# Patient Record
Sex: Female | Born: 2000
Health system: Southern US, Community
[De-identification: ages and names within clinical notes are randomized; demographics above are authoritative.]

## PROBLEM LIST (undated history)

## (undated) DIAGNOSIS — L309 Dermatitis, unspecified: Secondary | ICD-10-CM

## (undated) DIAGNOSIS — J302 Other seasonal allergic rhinitis: Secondary | ICD-10-CM

## (undated) DIAGNOSIS — J45909 Unspecified asthma, uncomplicated: Secondary | ICD-10-CM

## (undated) DIAGNOSIS — J3081 Allergic rhinitis due to animal (cat) (dog) hair and dander: Secondary | ICD-10-CM

## (undated) HISTORY — DX: Unspecified asthma, uncomplicated: J45.909

## (undated) HISTORY — DX: Dermatitis, unspecified: L30.9

## (undated) HISTORY — DX: Allergic rhinitis due to animal (cat) (dog) hair and dander: J30.81

## (undated) HISTORY — PX: TONSILLECTOMY: SUR1361

---
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
## 2018-02-20 ENCOUNTER — Ambulatory Visit (INDEPENDENT_AMBULATORY_CARE_PROVIDER_SITE_OTHER): Payer: 59 | Admitting: Medical

## 2018-02-20 VITALS — BP 110/70 | HR 68 | Temp 98.2°F | Resp 16 | Ht <= 58 in | Wt 192.2 lb

## 2018-02-20 DIAGNOSIS — J301 Allergic rhinitis due to pollen: Secondary | ICD-10-CM | POA: Diagnosis not present

## 2018-02-20 DIAGNOSIS — F458 Other somatoform disorders: Secondary | ICD-10-CM

## 2018-02-20 DIAGNOSIS — J4520 Mild intermittent asthma, uncomplicated: Secondary | ICD-10-CM

## 2018-02-20 DIAGNOSIS — R0989 Other specified symptoms and signs involving the circulatory and respiratory systems: Secondary | ICD-10-CM

## 2018-02-20 DIAGNOSIS — R142 Eructation: Secondary | ICD-10-CM | POA: Diagnosis not present

## 2018-02-20 DIAGNOSIS — R198 Other specified symptoms and signs involving the digestive system and abdomen: Secondary | ICD-10-CM

## 2018-02-20 MED ORDER — MOMETASONE FURO-FORMOTEROL FUM 100-5 MCG/ACT IN AERO: 2.0000 | INHALATION_SPRAY | Freq: Two times a day (BID) | RESPIRATORY_TRACT | 5 refills | Status: DC

## 2018-02-20 MED ORDER — ALBUTEROL SULFATE HFA 108 (90 BASE) MCG/ACT IN AERS: 2.0000 | INHALATION_SPRAY | Freq: Four times a day (QID) | RESPIRATORY_TRACT | 0 refills | Status: DC | PRN

## 2018-02-20 MED ORDER — OMEPRAZOLE 40 MG PO CPDR: 40.0000 mg | DELAYED_RELEASE_CAPSULE | Freq: Every day | ORAL | 0 refills | Status: DC

## 2018-02-20 MED ORDER — MONTELUKAST SODIUM 10 MG PO TABS: 10.0000 mg | ORAL_TABLET | Freq: Every day | ORAL | 5 refills | Status: DC

## 2018-02-20 NOTE — Patient Instructions (Signed)
Begin Omeprazole acid reflux medication once daily in the morning about 45 minutes before breakfast for chest pressure, fullness and feeling of gum stuck in chest.    If not much improved within a week, then call back  Asthma: . Avoid asthma triggers such as pollen, smoke, or cold temperatures for example . You asthma is currently considered mild intermittent asthma. . In the spring and fall you can use Dulera inhaled medication 2 puffs twice daily for prevention.  This medication is a Chief of Staff" medication.  . You may use "Albuterol" rescue inhaler 1-2 puffs every 4-6 hours as needed for wheezing, shortness of breath, chest tightness or cough spells.   This is a Data processing manager" medication. . You may use "Albuterol" rescue nebulized treatment every 4-6 hours as needed for wheezing, shortness of breath, chest tightness or cough spells.   This is a Data processing manager" medication. . Begin Singulair tablet daily at bedtime to help reduce allergy symptoms which can trigger asthma symptoms.  . Use this regimen above daily during spring and fall.   Some people need to continue this regimen in the spring and fall, some people may only need to use this regimen during times of respiratory illness such as colds and flu, but some people with severe asthma need to continue this regimen year round. . Call if you have questions, or call or return if you are not improving on this regimen.  . Plan to recheck in the next 3 months for physical.   ASTHMA MEDICATIONS Two types of medications are used in asthma treatment: quick relief (or rescue) medications and controller medications. All patients with persistent asthma should have a short-acting relief medication on hand for treatment of exacerbations and a controller medication for long-term control.    Rescue medications include Albuterol, Proventil, Ventolin, ProAir, Xopenex.  Rescue medications, also called quick-relief or fast-acting medications, work immediately to relieve  asthma symptoms when they occur. They're often inhaled directly into the lungs, where they open up the airways and relieve symptoms such as wheezing, coughing, and shortness of breath, often within minutes. But as effective as they are, rescue medications don't have a long-term effect.  Controller medications include inhaled corticosteroids (ICSs), long-acting beta agonists (LABAs), leukotriene modifiers, anti-IgE medications, and combination products.  Examples include Qvar, Asmanex, Pulmicort, Advair, Symbicort, and Dulera.  Controller medications, also called preventive or maintenance medications, work over a period of time to reduce airway inflammation and help prevent asthma symptoms from occurring. They may be inhaled or swallowed as a pill or liquid.

## 2018-02-20 NOTE — Progress Notes (Signed)
Subjective:  Robyn Cook is a 17 y.o. female who presents for Chief Complaint  Patient presents with  . swollowed gum and got stuck    got stuck in chest X yesterday     She is here today for a sensation in her chest, and got stuck.  She was chewing gum yesterday and felt like a stringy piece got stuck in her throat.  She does note belching, some fullness in the chest.  She does eat acidic foods such as citrus regularly.  Denies history of swallowing issues in himself or family.  She does have asthma and needs refills on her medications.  She has allergy problems around but does get asthma flareups in spring and fall.  Her last visit here was 2015.  He denies fever, no nausea vomiting, no diarrhea, no blood in the stool.  No facial swelling tongue swelling or itchy is in the mouth.  No other aggravating or relieving factors.    No other c/o.  The following portions of the patient's history were reviewed and updated as appropriate: allergies, current medications, past family history, past medical history, past social history, past surgical history and problem list.  ROS Otherwise as in subjective above  Objective: BP 110/70   Pulse 68   Temp 98.2 F (36.8 C) (Oral)   Resp 16   Ht 4' 9.5" (1.461 m)   Wt 192 lb 3.2 oz (87.2 kg)   SpO2 98%   BMI 40.87 kg/m   General appearance: alert, no distress, well developed, well nourished HEENT: normocephalic, sclerae anicteric, conjunctiva pink and moist, TMs pearly, nares patent, no discharge or erythema, pharynx normal Oral cavity: MMM, no lesions Neck: supple, no lymphadenopathy, no thyromegaly, no masses Heart: RRR, normal S1, S2, no murmurs Lungs: CTA bilaterally, no wheezes, rhonchi, or rales Abdomen: +bs, soft, non tender, non distended, no masses, no hepatomegaly, no splenomegaly Pulses: 2+ radial pulses, 2+ pedal pulses, normal cap refill Ext: no edema   Assessment: Encounter Diagnoses  Name Primary?  . Globus sensation  Yes  . Belching   . Mild intermittent asthma without complication   . Allergic rhinitis due to pollen, unspecified seasonality      Plan: Discussed concerns, symptoms.  Doubt food item stuck in throat, but if not improving with recommendations below within 3-4 days, then call back.  Begin Omeprazole acid reflux medication once daily in the morning about 45 minutes before breakfast for chest pressure, fullness and feeling of gum stuck in chest.  I suspect globus sensation from GERD.  If not much improved within a week, then call back  Asthma: . Avoid asthma triggers such as pollen, smoke, or cold temperatures for example . You asthma is currently considered mild intermittent asthma. . In the spring and fall you can use Dulera inhaled medication 2 puffs twice daily for prevention.  This medication is a Chief of Staff" medication.  . You may use "Albuterol" rescue inhaler 1-2 puffs every 4-6 hours as needed for wheezing, shortness of breath, chest tightness or cough spells.   This is a Data processing manager" medication. . You may use "Albuterol" rescue nebulized treatment every 4-6 hours as needed for wheezing, shortness of breath, chest tightness or cough spells.   This is a Data processing manager" medication. . Begin Singulair tablet daily at bedtime to help reduce allergy symptoms which can trigger asthma symptoms.  . Use this regimen above daily during spring and fall.   Some people need to continue this regimen in the spring and fall,  some people may only need to use this regimen during times of respiratory illness such as colds and flu, but some people with severe asthma need to continue this regimen year round. . Call if you have questions, or call or return if you are not improving on this regimen.  . Plan to recheck in the next 3 months for physical.  Robyn Cook was seen today for swollowed gum and got stuck.  Diagnoses and all orders for this visit:  Globus sensation  Belching  Mild intermittent asthma without  complication  Allergic rhinitis due to pollen, unspecified seasonality  Other orders -     albuterol (PROVENTIL HFA;VENTOLIN HFA) 108 (90 Base) MCG/ACT inhaler; Inhale 2 puffs into the lungs every 6 (six) hours as needed for wheezing. -     montelukast (SINGULAIR) 10 MG tablet; Take 1 tablet (10 mg total) by mouth at bedtime. -     mometasone-formoterol (DULERA) 100-5 MCG/ACT AERO; Inhale 2 puffs into the lungs 2 (two) times daily. -     omeprazole (PRILOSEC) 40 MG capsule; Take 1 capsule (40 mg total) by mouth daily.    Follow up: 2-3 mo for physical

## 2018-02-22 ENCOUNTER — Other Ambulatory Visit: Payer: Self-pay | Admitting: Medical

## 2018-02-22 ENCOUNTER — Telehealth: Payer: Self-pay | Admitting: Medical

## 2018-02-22 NOTE — Telephone Encounter (Signed)
I received notice that insurance would not pay for [facility], but instead wants to be switched to Qvar.  This is not an apples to apples comparison medication.  Qvar is much weaker so no I do not agree to change to this.  Alternates would be Symbicort, Advair, Breo.  See if we can get 1 of these covered?

## 2018-02-28 NOTE — Telephone Encounter (Signed)
Called pharmacy and switched Elwin Sleightulera to Symbicort and went thru insurance for $65, faxed discount card

## 2018-03-01 MED ORDER — BUDESONIDE-FORMOTEROL FUMARATE 80-4.5 MCG/ACT IN AERO: 2.0000 | INHALATION_SPRAY | Freq: Two times a day (BID) | RESPIRATORY_TRACT | 0 refills | Status: DC

## 2018-03-01 NOTE — Telephone Encounter (Signed)
Called pharmacy back and after discount card went thru for $0.  Called mom and informed

## 2018-06-05 ENCOUNTER — Other Ambulatory Visit: Payer: Self-pay

## 2018-06-05 ENCOUNTER — Ambulatory Visit (HOSPITAL_COMMUNITY)
Admission: EM | Admit: 2018-06-05 | Discharge: 2018-06-05 | Disposition: A | Discharge status: Home / Self Care | Payer: 59 | Attending: Family Medicine | Admitting: Family Medicine

## 2018-06-05 ENCOUNTER — Encounter (HOSPITAL_COMMUNITY): Payer: Self-pay | Admitting: Emergency Medicine

## 2018-06-05 DIAGNOSIS — Z91048 Other nonmedicinal substance allergy status: Secondary | ICD-10-CM | POA: Diagnosis not present

## 2018-06-05 DIAGNOSIS — R0981 Nasal congestion: Secondary | ICD-10-CM

## 2018-06-05 DIAGNOSIS — M791 Myalgia, unspecified site: Secondary | ICD-10-CM

## 2018-06-05 DIAGNOSIS — J029 Acute pharyngitis, unspecified: Secondary | ICD-10-CM | POA: Diagnosis present

## 2018-06-05 DIAGNOSIS — R05 Cough: Secondary | ICD-10-CM | POA: Diagnosis not present

## 2018-06-05 DIAGNOSIS — Z79899 Other long term (current) drug therapy: Secondary | ICD-10-CM | POA: Insufficient documentation

## 2018-06-05 DIAGNOSIS — Z7951 Long term (current) use of inhaled steroids: Secondary | ICD-10-CM | POA: Insufficient documentation

## 2018-06-05 DIAGNOSIS — Z7722 Contact with and (suspected) exposure to environmental tobacco smoke (acute) (chronic): Secondary | ICD-10-CM | POA: Diagnosis not present

## 2018-06-05 LAB — POCT RAPID STREP A: STREPTOCOCCUS, GROUP A SCREEN (DIRECT): NEGATIVE

## 2018-06-05 MED ORDER — AMOXICILLIN 500 MG PO CAPS: 1000.0000 mg | ORAL_CAPSULE | Freq: Two times a day (BID) | ORAL | 0 refills | Status: DC

## 2018-06-05 NOTE — Discharge Instructions (Signed)
We will go ahead and treat you for strep throat based on your symptoms and physical exam. Antibiotics sent to pharmacy. You can do warm salt water gargles. Hot tea with honey can help Your other symptoms you can use over-the-counter medication. Follow up as needed for continued or worsening symptoms

## 2018-06-05 NOTE — ED Triage Notes (Signed)
Pt complains of nasal congestion, cough, sore throat and bilateral ear pain since Sunday.  Pt has been taking Advil for pain.

## 2018-06-05 NOTE — ED Provider Notes (Signed)
[facility]    CSN: [id number] Arrival date & time: 06/05/18  1609     History   Chief Complaint Chief Complaint  Patient presents with  . URI    HPI Robyn Cook is a 17 y.o. female.   Patient is a 17 year old female that presents with 4 days of URI symptoms.  She is positive for cough, congestion, sore throat, ear pain, body aches.  She has some tonsillar swelling with exudates. Uvula swollen.  Her symptoms have been constant and worsening.  She has Been taking Tylenol for her symptoms. Some relief with tylenol. No rashes or recent sick contacts. The sore throat has been severe. She denies any trouble swallowing or breathing.  ROS per HPI      Past Medical History:  Diagnosis Date  . Allergy to cats   . Asthma   . Eczema     Patient Active Problem List   Diagnosis Date Noted  . Allergic rhinitis 07/01/2013  . Asthma exacerbation 07/01/2013    History reviewed. No pertinent surgical history.  OB History   None      Home Medications    Prior to Admission medications   Medication Sig Start Date End Date Taking? Authorizing Provider  albuterol (PROVENTIL HFA;VENTOLIN HFA) 108 (90 Base) MCG/ACT inhaler Inhale 2 puffs into the lungs every 6 (six) hours as needed for wheezing. 02/20/18  Yes Tysinger, Kermit Balo, PA-C  Etonogestrel (NEXPLANON [place]) Inject into the skin.   Yes [provider]  montelukast (SINGULAIR) 10 MG tablet Take 1 tablet (10 mg total) by mouth at bedtime. 02/20/18  Yes Tysinger, Kermit Balo, PA-C  amoxicillin (AMOXIL) 500 MG capsule Take 2 capsules (1,000 mg total) by mouth 2 (two) times daily. 06/05/18   Dahlia Byes A, NP  budesonide-formoterol (SYMBICORT) 80-4.5 MCG/ACT inhaler Inhale 2 puffs into the lungs 2 (two) times daily. 03/01/18   Tysinger, Kermit Balo, PA-C  omeprazole (PRILOSEC) 40 MG capsule Take 1 capsule (40 mg total) by mouth daily. 02/20/18   Tysinger, Kermit Balo, PA-C    Family History No family history on  file.  Social History Social History   Tobacco Use  . Smoking status: Passive Smoke Exposure - Never Smoker  . Smokeless tobacco: Never Used  Substance Use Topics  . Alcohol use: No  . Drug use: Not on file     Allergies   Patient has no known allergies.   Review of Systems Review of Systems   Physical Exam Triage Vital Signs ED Triage Vitals  Enc Vitals Group     BP 06/05/18 1718 110/75     Pulse Rate 06/05/18 1718 99     Resp 06/05/18 1718 16     Temp 06/05/18 1718 98.7 F (37.1 C)     Temp Source 06/05/18 1718 Oral     SpO2 06/05/18 1718 99 %     Weight --      Height --      Head Circumference --      Peak Flow --      Pain Score 06/05/18 1722 8     Pain Loc --      Pain Edu? --      Excl. in GC? --    No data found.  Updated Vital Signs BP 110/75 (BP Location: Left Arm)   Pulse 99   Temp 98.7 F (37.1 C) (Oral)   Resp 16   SpO2 99%   Visual Acuity Right Eye Distance:  Left Eye Distance:   Bilateral Distance:    Right Eye Near:   Left Eye Near:    Bilateral Near:     Physical Exam  Constitutional: She is oriented to person, place, and time. She appears well-developed and well-nourished.  Very pleasant. Non toxic or ill appearing.     HENT:  Head: Normocephalic and atraumatic.  Bilateral TMs normal.  External ears normal.   posterior oropharyngeal erythema,3+ tonsillar swelling with  exudates. Uvula swelling.   tonsillar lymphadenopathy present.     Eyes: Conjunctivae are normal.  Neck: Normal range of motion.  Cardiovascular: Normal rate, regular rhythm and normal heart sounds.  Pulmonary/Chest:  Lungs clear in all fields. No dyspnea or distress. No retractions or nasal flaring.     Musculoskeletal: Normal range of motion.  Neurological: She is alert and oriented to person, place, and time.  Skin: Skin is warm and dry.  Psychiatric: She has a normal mood and affect.  Nursing note and vitals reviewed.    UC Treatments /  Results  Labs (all labs ordered are listed, but only abnormal results are displayed) Labs Reviewed  CULTURE, GROUP A STREP [facility])  POCT RAPID STREP A    EKG None  Radiology No results found.  Procedures Procedures (including critical care time)  Medications Ordered in UC Medications - No data to display  Initial Impression / Assessment and Plan / UC Course  I have reviewed the triage vital signs and the nursing notes.  Pertinent labs & imaging results that were available during my care of the patient were reviewed by me and considered in my medical decision making (see chart for details).     Strep test negative- will go ahead and treat with antibiotics based on physical exam and symptoms.  Tylenol and motrin for pain and fever. Follow up as needed for continued or worsening symptoms  Final Clinical Impressions(s) / UC Diagnoses   Final diagnoses:  Acute sore throat     Discharge Instructions     We will go ahead and treat you for strep throat based on your symptoms and physical exam. Antibiotics sent to pharmacy. You can do warm salt water gargles. Hot tea with honey can help Your other symptoms you can use over-the-counter medication. Follow up as needed for continued or worsening symptoms     ED Prescriptions    Medication Sig Dispense Auth. Provider   amoxicillin (AMOXIL) 500 MG capsule Take 2 capsules (1,000 mg total) by mouth 2 (two) times daily. 40 capsule Dahlia Byes A, NP     Controlled Substance Prescriptions [place] Controlled Substance Registry consulted? Not Applicable   Janace Aris, NP 06/06/18 [phone]

## 2018-06-08 LAB — CULTURE, GROUP A STREP (THRC)

## 2018-06-12 ENCOUNTER — Ambulatory Visit: Payer: 59 | Admitting: Medical

## 2018-06-18 ENCOUNTER — Encounter: Payer: Self-pay | Admitting: Medical

## 2018-06-18 ENCOUNTER — Ambulatory Visit (INDEPENDENT_AMBULATORY_CARE_PROVIDER_SITE_OTHER): Payer: 59 | Admitting: Medical

## 2018-06-18 VITALS — BP 112/70 | HR 108 | Temp 98.0°F | Resp 16 | Ht <= 58 in | Wt 192.8 lb

## 2018-06-18 DIAGNOSIS — J029 Acute pharyngitis, unspecified: Secondary | ICD-10-CM

## 2018-06-18 DIAGNOSIS — J039 Acute tonsillitis, unspecified: Secondary | ICD-10-CM | POA: Diagnosis not present

## 2018-06-18 LAB — POCT RAPID STREP A (OFFICE): RAPID STREP A SCREEN: NEGATIVE

## 2018-06-18 LAB — POCT MONO (EPSTEIN BARR VIRUS): Mono, POC: NEGATIVE

## 2018-06-18 MED ORDER — AZITHROMYCIN 250 MG PO TABS: ORAL_TABLET | ORAL | 0 refills | Status: DC

## 2018-06-18 MED ORDER — CEFTRIAXONE SODIUM 500 MG IJ SOLR
500.0000 mg | Freq: Once | INTRAMUSCULAR | Status: AC
  Administered 2018-06-18: 500 mg via INTRAMUSCULAR

## 2018-06-18 MED ORDER — PREDNISONE 10 MG PO TABS: ORAL_TABLET | ORAL | 0 refills | Status: DC

## 2018-06-18 NOTE — Progress Notes (Signed)
  Subjective: Robyn Cook is a 17 y.o. female who presents for evaluation of sore throat.  Has had swollen tonsils and sore throat for a few weeks now.  Was seen by urgent care 2 weeks ago, negative for strep initially but was put on amoxicillin she thinks.  Not any better at this point.  Has had body aches, chills, ear pain, horrible throat, fatigue.   No specific sick contacts.   Last intimate with boyfriend late August about a month ago (including oral sex), also share drinks with her brother at times.   Using pain killers for the throat pain.  No other aggravating or relieving factors.  No other c/o.  The following portions of the patient's history were reviewed and updated as appropriate: allergies, current medications, past medical history, past social history, past surgical history and problem list.  Past Medical History:  Diagnosis Date  . Allergy to cats   . Asthma   . Eczema     ROS as in subjective    Objective: BP 112/70   Pulse (!) 108   Temp 98 F (36.7 C) (Oral)   Resp 16   Ht 4\' 9"  (1.448 m)   Wt 192 lb 12.8 oz (87.5 kg)   SpO2 99%   BMI 41.72 kg/m   General appearance: no distress, WD/WN,somewhat ill-appearing HEENT: normocephalic, conjunctiva/corneas normal, sclerae anicteric, nares patent,mild mucoid discharge and erythema, pharynx with moderate erythema, bilat 2+ tonsils with erythema and white exudate.  Oral cavity: MMM, no lesions  Neck: supple, no lymphadenopathy, no thyromegaly Heart: RRR, normal S1, S2, no murmurs Lungs: CTA bilaterally, no wheezes, rhonchi, or rales Abdomen: +bs, soft, shotty tender anterior nodes bilat, non distended, no masses, no hepatomegaly, no splenomegaly   Laboratory Strep test done. Results:negative. Mono test done.   Results: negative    Assessment: Encounter Diagnoses  Name Primary?  . Sore throat Yes  . Tonsillitis      Plan: Gave 500mg  IM Rocephin in office, begin medications below.   Medications  prescribed today:   Advised that sore throat etiology appears to be bacterial.  Discussed symptoms, diagnosis, and possible complications including peritonsillar abscess formation.  Discussed means of prevention, precautions.  Supportive care recommended including OTC analgesics, salt water gargles, warm fluids, good hydration, and rest.  Discussed signs or symptoms that would prompt immediate evaluation.   Call or return if worse or not improving in the next 2-3 days.  Patient voiced understanding of diagnosis, recommendations, and treatment plan.  After visit summary given.   Robyn Cook was seen today for sore throat.  Diagnoses and all orders for this visit:  Sore throat -     Rapid Strep A -     Mono (Epstein Barr Virus) -     cefTRIAXone (ROCEPHIN) injection 500 mg  Tonsillitis -     cefTRIAXone (ROCEPHIN) injection 500 mg  Other orders -     azithromycin (ZITHROMAX) 250 MG tablet; 2 tablets day 1, then 1 tablet days 2-4 -     predniSONE (DELTASONE) 10 MG tablet; 6/5/4/3/2/1 taper

## 2018-11-18 ENCOUNTER — Ambulatory Visit (HOSPITAL_COMMUNITY)
Admission: EM | Admit: 2018-11-18 | Discharge: 2018-11-18 | Disposition: A | Discharge status: Home / Self Care | Payer: 59 | Attending: Family Medicine | Admitting: Family Medicine

## 2018-11-18 ENCOUNTER — Encounter (HOSPITAL_COMMUNITY): Payer: Self-pay | Admitting: Emergency Medicine

## 2018-11-18 DIAGNOSIS — J020 Streptococcal pharyngitis: Secondary | ICD-10-CM

## 2018-11-18 LAB — POCT RAPID STREP A: STREPTOCOCCUS, GROUP A SCREEN (DIRECT): POSITIVE — AB

## 2018-11-18 MED ORDER — BENZONATATE 100 MG PO CAPS: 100.0000 mg | ORAL_CAPSULE | Freq: Three times a day (TID) | ORAL | 0 refills | Status: DC

## 2018-11-18 MED ORDER — CETIRIZINE-PSEUDOEPHEDRINE ER 5-120 MG PO TB12: 1.0000 | ORAL_TABLET | Freq: Every day | ORAL | 0 refills | Status: DC

## 2018-11-18 MED ORDER — AMOXICILLIN-POT CLAVULANATE 875-125 MG PO TABS: 1.0000 | ORAL_TABLET | Freq: Two times a day (BID) | ORAL | 0 refills | Status: DC

## 2018-11-18 NOTE — Discharge Instructions (Signed)
Your rapid stress test was positive Amoxicillin daily for the next 10 days You can continue the Mucinex for other symptoms Tessalon Perls for worsening cough every 8 hours as needed Tylenol/ibuprofen for body aches and fever Follow up as needed for continued or worsening symptoms

## 2018-11-18 NOTE — ED Provider Notes (Signed)
[facility]    CSN: [id number] Arrival date & time: 11/18/18  1309     History   Chief Complaint Chief Complaint  Patient presents with  . Cough    HPI Robyn Cook is a 18 y.o. female.   Patient is a 18 year old female who presents with cough, congestion, rhinorrhea.  Her symptoms have been present and worsening over the past month.  She has now developed fever and sore throat.  She has been using Mucinex max without much relief of her symptoms.  Denies any recent sick contacts or traveling.      Past Medical History:  Diagnosis Date  . Allergy to cats   . Asthma   . Eczema     Patient Active Problem List   Diagnosis Date Noted  . Allergic rhinitis 07/01/2013  . Asthma exacerbation 07/01/2013    History reviewed. No pertinent surgical history.  OB History   No obstetric history on file.      Home Medications    Prior to Admission medications   Medication Sig Start Date End Date Taking? Authorizing Provider  albuterol (PROVENTIL HFA;VENTOLIN HFA) 108 (90 Base) MCG/ACT inhaler Inhale 2 puffs into the lungs every 6 (six) hours as needed for wheezing. 02/20/18   Tysinger, Kermit Balo, PA-C  amoxicillin-clavulanate (AUGMENTIN) 875-125 MG tablet Take 1 tablet by mouth every 12 (twelve) hours. 11/18/18   Dahlia Byes A, NP  benzonatate (TESSALON) 100 MG capsule Take 1 capsule (100 mg total) by mouth every 8 (eight) hours. 11/18/18   Dahlia Byes A, NP  budesonide-formoterol (SYMBICORT) 80-4.5 MCG/ACT inhaler Inhale 2 puffs into the lungs 2 (two) times daily. 03/01/18   Tysinger, Kermit Balo, PA-C  cetirizine-pseudoephedrine (ZYRTEC-D) 5-120 MG tablet Take 1 tablet by mouth daily. 11/18/18   Janace Aris, NP  Etonogestrel (NEXPLANON [place]) Inject into the skin.    [provider]  ibuprofen (ADVIL,MOTRIN) 200 MG tablet Take 200 mg by mouth every 6 (six) hours as needed.    [provider]  montelukast (SINGULAIR) 10 MG tablet Take 1 tablet (10 mg  total) by mouth at bedtime. 02/20/18   Tysinger, Kermit Balo, PA-C  omeprazole (PRILOSEC) 40 MG capsule Take 1 capsule (40 mg total) by mouth daily. 02/20/18   Tysinger, Kermit Balo, PA-C    Family History No family history on file.  Social History Social History   Tobacco Use  . Smoking status: Passive Smoke Exposure - Never Smoker  . Smokeless tobacco: Never Used  Substance Use Topics  . Alcohol use: No  . Drug use: Not on file     Allergies   Patient has no known allergies.   Review of Systems Review of Systems  Constitutional: Positive for activity change, chills, fatigue and fever.  HENT: Positive for congestion, postnasal drip, rhinorrhea, sinus pressure, sinus pain and sore throat.   Respiratory: Positive for cough and chest tightness.   Cardiovascular: Negative for leg swelling.  Musculoskeletal: Positive for myalgias.  Skin: Negative for color change, pallor, rash and wound.  Hematological: Positive for adenopathy.     Physical Exam Triage Vital Signs ED Triage Vitals [11/18/18 1422]  Enc Vitals Group     BP 112/69     Pulse Rate (!) 124     Resp 18     Temp (!) 100.7 F (38.2 C)     Temp src      SpO2 100 %     Weight      Height  Head Circumference      Peak Flow      Pain Score 8     Pain Loc      Pain Edu?      Excl. in GC?    No data found.  Updated Vital Signs BP 112/69   Pulse (!) 124   Temp (!) 100.7 F (38.2 C)   Resp 18   SpO2 100%   Visual Acuity Right Eye Distance:   Left Eye Distance:   Bilateral Distance:    Right Eye Near:   Left Eye Near:    Bilateral Near:     Physical Exam Constitutional:      General: She is not in acute distress.    Appearance: She is ill-appearing. She is not toxic-appearing.  HENT:     Head: Normocephalic and atraumatic.     Right Ear: Tympanic membrane and ear canal normal.     Left Ear: Tympanic membrane and ear canal normal.     Nose: Congestion and rhinorrhea present.     Mouth/Throat:      Pharynx: Posterior oropharyngeal erythema present.     Tonsils: Tonsillar exudate present. Swelling: 3+ on the right. 3+ on the left.  Eyes:     Conjunctiva/sclera: Conjunctivae normal.  Neck:     Musculoskeletal: Normal range of motion.  Cardiovascular:     Rate and Rhythm: Tachycardia present.     Heart sounds: Normal heart sounds.  Pulmonary:     Effort: Pulmonary effort is normal.     Breath sounds: Normal breath sounds.  Musculoskeletal: Normal range of motion.  Lymphadenopathy:     Cervical: Cervical adenopathy present.  Skin:    General: Skin is warm and dry.  Neurological:     Mental Status: She is alert.  Psychiatric:        Mood and Affect: Mood normal.      UC Treatments / Results  Labs (all labs ordered are listed, but only abnormal results are displayed) Labs Reviewed  POCT RAPID STREP A - Abnormal; Notable for the following components:      Result Value   Streptococcus, Group A Screen (Direct) POSITIVE (*)    All other components within normal limits    EKG None  Radiology No results found.  Procedures Procedures (including critical care time)  Medications Ordered in UC Medications - No data to display  Initial Impression / Assessment and Plan / UC Course  I have reviewed the triage vital signs and the nursing notes.  Pertinent labs & imaging results that were available during my care of the patient were reviewed by me and considered in my medical decision making (see chart for details).     Patient's rapid strep test was positive We will go ahead and treat for strep throat with amoxicillin daily for the next 10 days This will also treat any lower respiratory tract infection or sinus infection that she may have. Tylenol/ibuprofen for body aches and fever Mucinex as needed for cough, congestion Tessalon Perles for worsening cough Follow up as needed for continued or worsening symptoms  Final Clinical Impressions(s) / UC Diagnoses   Final  diagnoses:  Strep pharyngitis     Discharge Instructions     Your rapid stress test was positive Amoxicillin daily for the next 10 days You can continue the Mucinex for other symptoms Tessalon Perls for worsening cough every 8 hours as needed Tylenol/ibuprofen for body aches and fever Follow up as needed for continued or worsening  symptoms     ED Prescriptions    Medication Sig Dispense Auth. Provider   amoxicillin-clavulanate (AUGMENTIN) 875-125 MG tablet Take 1 tablet by mouth every 12 (twelve) hours. 14 tablet Monroe Toure A, NP   benzonatate (TESSALON) 100 MG capsule Take 1 capsule (100 mg total) by mouth every 8 (eight) hours. 21 capsule Zeven Kocak A, NP   cetirizine-pseudoephedrine (ZYRTEC-D) 5-120 MG tablet Take 1 tablet by mouth daily. 30 tablet Dahlia Byes A, NP     Controlled Substance Prescriptions [place] Controlled Substance Registry consulted? Not Applicable   Janace Aris, NP 11/18/18 1502

## 2018-11-18 NOTE — ED Triage Notes (Signed)
Pt c/o cough x 1 month

## 2019-03-06 ENCOUNTER — Encounter: Payer: 59 | Admitting: Medical

## 2019-03-06 ENCOUNTER — Other Ambulatory Visit: Payer: Self-pay

## 2019-03-06 NOTE — Progress Notes (Signed)
error 

## 2019-04-15 ENCOUNTER — Ambulatory Visit (HOSPITAL_COMMUNITY)
Admission: EM | Admit: 2019-04-15 | Discharge: 2019-04-15 | Disposition: A | Discharge status: Home / Self Care | Payer: No Typology Code available for payment source | Attending: Family Medicine | Admitting: Family Medicine

## 2019-04-15 ENCOUNTER — Encounter (HOSPITAL_COMMUNITY): Payer: Self-pay

## 2019-04-15 ENCOUNTER — Other Ambulatory Visit: Payer: Self-pay

## 2019-04-15 DIAGNOSIS — Z20828 Contact with and (suspected) exposure to other viral communicable diseases: Secondary | ICD-10-CM

## 2019-04-15 DIAGNOSIS — Z20822 Contact with and (suspected) exposure to covid-19: Secondary | ICD-10-CM

## 2019-04-15 NOTE — ED Triage Notes (Signed)
Patient presents to Urgent Care with complaints of needing a COVID test since exposure at work. Patient reports she has no symptoms.

## 2019-04-15 NOTE — ED Provider Notes (Signed)
[place]    CSN: [id number] Arrival date & time: 04/15/19  0807      History   Chief Complaint Chief Complaint  Patient presents with  . COVID Testing    HPI Robyn Cook is a 18 y.o. female.   HPI  Patient is here requesting coronavirus testing.  There is been coronavirus reported in a couple of the building where she works.  She feels she is at increased risk because of asthma.  She has not been directly exposed to anyone with coronavirus that she knows of.  She is having no symptoms.  No increased shortness of breath, cough, fever chills, body aches.  Past Medical History:  Diagnosis Date  . Allergy to cats   . Asthma   . Eczema     Patient Active Problem List   Diagnosis Date Noted  . Allergic rhinitis 07/01/2013  . Asthma exacerbation 07/01/2013    History reviewed. No pertinent surgical history.  OB History   No obstetric history on file.      Home Medications    Prior to Admission medications   Medication Sig Start Date End Date Taking? Authorizing Provider  albuterol (PROVENTIL HFA;VENTOLIN HFA) 108 (90 Base) MCG/ACT inhaler Inhale 2 puffs into the lungs every 6 (six) hours as needed for wheezing. 02/20/18   Tysinger, Camelia Eng, PA-C  budesonide-formoterol (SYMBICORT) 80-4.5 MCG/ACT inhaler Inhale 2 puffs into the lungs 2 (two) times daily. 03/01/18   Tysinger, Camelia Eng, PA-C  Etonogestrel (NEXPLANON [place]) Inject into the skin.    [provider]  ibuprofen (ADVIL,MOTRIN) 200 MG tablet Take 200 mg by mouth every 6 (six) hours as needed.    [provider]  montelukast (SINGULAIR) 10 MG tablet Take 1 tablet (10 mg total) by mouth at bedtime. 02/20/18   Tysinger, Camelia Eng, PA-C  omeprazole (PRILOSEC) 40 MG capsule Take 1 capsule (40 mg total) by mouth daily. 02/20/18 04/15/19  Tysinger, Camelia Eng, PA-C    Family History Family History  Problem Relation Age of Onset  . Asthma Mother   . Hypertension Father     Social History  Social History   Tobacco Use  . Smoking status: Passive Smoke Exposure - Never Smoker  . Smokeless tobacco: Never Used  Substance Use Topics  . Alcohol use: No  . Drug use: Not on file     Allergies   Patient has no known allergies.   Review of Systems Review of Systems  Constitutional: Negative for chills and fever.  HENT: Negative for ear pain and sore throat.   Eyes: Negative for pain and visual disturbance.  Respiratory: Negative for cough and shortness of breath.   Cardiovascular: Negative for chest pain and palpitations.  Gastrointestinal: Negative for abdominal pain and vomiting.  Genitourinary: Negative for dysuria and hematuria.  Musculoskeletal: Negative for arthralgias and back pain.  Skin: Negative for color change and rash.  Neurological: Negative for seizures and syncope.  All other systems reviewed and are negative.    Physical Exam Triage Vital Signs ED Triage Vitals  Enc Vitals Group     BP 04/15/19 0841 127/75     Pulse Rate 04/15/19 0841 80     Resp 04/15/19 0841 16     Temp 04/15/19 0841 97.9 F (36.6 C)     Temp Source 04/15/19 0841 Temporal     SpO2 04/15/19 0841 100 %     Weight --      Height --  Head Circumference --      Peak Flow --      Pain Score 04/15/19 0845 0     Pain Loc --      Pain Edu? --      Excl. in [place]? --    No data found.  Updated Vital Signs BP 127/75 (BP Location: Left Arm)   Pulse 80   Temp 97.9 F (36.6 C) (Temporal)   Resp 16   SpO2 100%   Visual Acuity Right Eye Distance:   Left Eye Distance:   Bilateral Distance:    Right Eye Near:   Left Eye Near:    Bilateral Near:     Physical Exam Constitutional:      General: She is not in acute distress.    Appearance: She is well-developed.  HENT:     Head: Normocephalic and atraumatic.  Eyes:     Conjunctiva/sclera: Conjunctivae normal.     Pupils: Pupils are equal, round, and reactive to light.  Neck:     Musculoskeletal: Normal range of  motion.  Cardiovascular:     Rate and Rhythm: Normal rate and regular rhythm.     Heart sounds: Normal heart sounds.  Pulmonary:     Effort: Pulmonary effort is normal. No respiratory distress.     Breath sounds: Normal breath sounds.  Abdominal:     General: There is no distension.     Palpations: Abdomen is soft.  Musculoskeletal: Normal range of motion.  Skin:    General: Skin is warm and dry.  Neurological:     Mental Status: She is alert.  Psychiatric:        Behavior: Behavior normal.      UC Treatments / Results  Labs (all labs ordered are listed, but only abnormal results are displayed) Labs Reviewed  NOVEL CORONAVIRUS, NAA (HOSPITAL ORDER, SEND-OUT TO REF LAB)    EKG   Radiology No results found.  Procedures Procedures (including critical care time)  Medications Ordered in UC Medications - No data to display  Initial Impression / Assessment and Plan / UC Course  I have reviewed the triage vital signs and the nursing notes.  Pertinent labs & imaging results that were available during my care of the patient were reviewed by me and considered in my medical decision making (see chart for details).    We discussed coronavirus.  Contagiousness.  Exposure.  Patient thought she could come in for coronavirus test and then go back to work today.  I explained to her that if she has another concern to warrant Cross virus testing, she must stay home until her test result is confirmed negative. Final Clinical Impressions(s) / UC Diagnoses   Final diagnoses:  Exposure to Covid-19 Virus     Discharge Instructions     You may not return to work until the covid test is confirmed negative    ED Prescriptions    None     Controlled Substance Prescriptions [place] Controlled Substance Registry consulted? Not Applicable   Eustace MooreNelson, Ouita Nish Sue, MD 04/15/19 [phone]

## 2019-04-15 NOTE — Discharge Instructions (Signed)
You may not return to work until the covid test is confirmed negative

## 2019-04-17 LAB — NOVEL CORONAVIRUS, NAA (HOSP ORDER, SEND-OUT TO REF LAB; TAT 18-24 HRS): SARS-CoV-2, NAA: NOT DETECTED

## 2019-08-22 ENCOUNTER — Encounter (HOSPITAL_COMMUNITY): Payer: Self-pay | Admitting: Emergency Medicine

## 2019-08-22 ENCOUNTER — Emergency Department (HOSPITAL_COMMUNITY)
Admission: EM | Admit: 2019-08-22 | Discharge: 2019-08-22 | Disposition: A | Discharge status: Home / Self Care | Payer: No Typology Code available for payment source | Attending: Emergency Medicine | Admitting: Emergency Medicine

## 2019-08-22 ENCOUNTER — Other Ambulatory Visit: Payer: Self-pay

## 2019-08-22 DIAGNOSIS — J029 Acute pharyngitis, unspecified: Secondary | ICD-10-CM | POA: Insufficient documentation

## 2019-08-22 DIAGNOSIS — Z79899 Other long term (current) drug therapy: Secondary | ICD-10-CM | POA: Diagnosis not present

## 2019-08-22 DIAGNOSIS — Z7722 Contact with and (suspected) exposure to environmental tobacco smoke (acute) (chronic): Secondary | ICD-10-CM | POA: Insufficient documentation

## 2019-08-22 LAB — GROUP A STREP BY PCR: Group A Strep by PCR: NOT DETECTED

## 2019-08-22 MED ORDER — DEXAMETHASONE 6 MG PO TABS
10.0000 mg | ORAL_TABLET | Freq: Once | ORAL | Status: AC
  Administered 2019-08-22: 10 mg via ORAL
  Filled 2019-08-22: qty 1

## 2019-08-22 NOTE — ED Triage Notes (Signed)
Patient reports sore throat with swelling onset this week , no cough , denies fever or chills . Airway intact / no oral swelling , respirations unlabored .

## 2019-08-22 NOTE — Discharge Instructions (Addendum)
Please follow up with your ENT doctor for recheck of sore throat. Return to the emergency department with any worsening symptoms or new concern.

## 2019-08-22 NOTE — ED Provider Notes (Signed)
[facility] Provider Note   CSN: [id number] Arrival date & time: 08/22/19  0112     History   Chief Complaint Chief Complaint  Patient presents with  . Sore Throat    HPI Robyn Cook is a 18 y.o. female.     Patient to ED with pain from tonsil stones and swelling. She has a history of tonsillar stones and has been able to remove a few on her own this week. She now feels like there is more swelling so presented for evaluation. No fever, congestion, cough, body aches. She is followed by ENT and reports multiple peritonsillar abscesses in the past as well and discussion with ENT about removing her tonsils.   The history is provided by the patient. No language interpreter was used.  Sore Throat Pertinent negatives include no headaches and no shortness of breath.    Past Medical History:  Diagnosis Date  . Allergy to cats   . Asthma   . Eczema     Patient Active Problem List   Diagnosis Date Noted  . Allergic rhinitis 07/01/2013  . Asthma exacerbation 07/01/2013    History reviewed. No pertinent surgical history.   OB History   No obstetric history on file.      Home Medications    Prior to Admission medications   Medication Sig Start Date End Date Taking? Authorizing Provider  albuterol (PROVENTIL HFA;VENTOLIN HFA) 108 (90 Base) MCG/ACT inhaler Inhale 2 puffs into the lungs every 6 (six) hours as needed for wheezing. 02/20/18   Tysinger, Camelia Eng, PA-C  budesonide-formoterol (SYMBICORT) 80-4.5 MCG/ACT inhaler Inhale 2 puffs into the lungs 2 (two) times daily. 03/01/18   Tysinger, Camelia Eng, PA-C  Etonogestrel (NEXPLANON [place]) Inject into the skin.    [provider]  ibuprofen (ADVIL,MOTRIN) 200 MG tablet Take 200 mg by mouth every 6 (six) hours as needed.    [provider]  montelukast (SINGULAIR) 10 MG tablet Take 1 tablet (10 mg total) by mouth at bedtime. 02/20/18   Tysinger, Camelia Eng, PA-C  omeprazole  (PRILOSEC) 40 MG capsule Take 1 capsule (40 mg total) by mouth daily. 02/20/18 04/15/19  Tysinger, Camelia Eng, PA-C    Family History Family History  Problem Relation Age of Onset  . Asthma Mother   . Hypertension Father     Social History Social History   Tobacco Use  . Smoking status: Passive Smoke Exposure - Never Smoker  . Smokeless tobacco: Never Used  Substance Use Topics  . Alcohol use: No  . Drug use: Never     Allergies   Patient has no known allergies.   Review of Systems Review of Systems  Constitutional: Negative for chills and fever.  HENT: Positive for sore throat. Negative for congestion, ear pain, rhinorrhea and trouble swallowing.   Respiratory: Negative for cough and shortness of breath.   Gastrointestinal: Negative for nausea.  Musculoskeletal: Negative for myalgias.  Neurological: Negative for headaches.     Physical Exam Updated Vital Signs BP 117/79 (BP Location: Right Arm)   Pulse 98   Temp 99.1 F (37.3 C) (Oral)   Resp 20   Ht 4\' 10"  (1.473 m)   Wt 84.8 kg   SpO2 99%   BMI 39.08 kg/m   Physical Exam Constitutional:      Appearance: She is well-developed.  HENT:     Nose: No rhinorrhea.     Mouth/Throat:     Mouth: Mucous membranes are moist.  Comments: Large tonsils that do not appear edematous. No tonsillar stones visualized. No erythema. No peritonsillar abscess visualized. Uvula midline.  Neck:     Musculoskeletal: Normal range of motion.  Pulmonary:     Effort: Pulmonary effort is normal.  Lymphadenopathy:     Cervical: No cervical adenopathy.  Skin:    General: Skin is warm and dry.  Neurological:     Mental Status: She is alert and oriented to person, place, and time.      ED Treatments / Results  Labs (all labs ordered are listed, but only abnormal results are displayed) Labs Reviewed  GROUP A STREP BY PCR    EKG None  Radiology No results found.  Procedures Procedures (including critical care time)   Medications Ordered in ED Medications - No data to display   Initial Impression / Assessment and Plan / ED Course  I have reviewed the triage vital signs and the nursing notes.  Pertinent labs & imaging results that were available during my care of the patient were reviewed by me and considered in my medical decision making (see chart for details).        Patient to ED with sore throat developing over the last week, presence of recurrent tonsillar stones and now swelling. History of PT abscess. No fever.   Strep is negative. No evidence concerning for abscess formation. Will provide single dose decadron for swelling. Encouraged to see her ENT for recheck in the coming week.   Final Clinical Impressions(s) / ED Diagnoses   Final diagnoses:  None   1. Pharyngitis   ED Discharge Orders    None       Elpidio Anis, PA-C 08/22/19 0400    Wilkie Aye Mayer Masker, MD 08/22/19 [phone]

## 2019-09-03 ENCOUNTER — Ambulatory Visit (HOSPITAL_COMMUNITY)
Admission: EM | Admit: 2019-09-03 | Discharge: 2019-09-03 | Disposition: A | Discharge status: Home / Self Care | Payer: No Typology Code available for payment source | Attending: Emergency Medicine | Admitting: Emergency Medicine

## 2019-09-03 ENCOUNTER — Other Ambulatory Visit: Payer: Self-pay

## 2019-09-03 ENCOUNTER — Encounter (HOSPITAL_COMMUNITY): Payer: Self-pay | Admitting: Emergency Medicine

## 2019-09-03 DIAGNOSIS — Z20828 Contact with and (suspected) exposure to other viral communicable diseases: Secondary | ICD-10-CM | POA: Diagnosis present

## 2019-09-03 DIAGNOSIS — Z0489 Encounter for examination and observation for other specified reasons: Secondary | ICD-10-CM | POA: Diagnosis not present

## 2019-09-03 DIAGNOSIS — Z20822 Contact with and (suspected) exposure to covid-19: Secondary | ICD-10-CM

## 2019-09-03 NOTE — Discharge Instructions (Addendum)
Your COVID test result  will be available in 2 -7 days You should remain isolated in your home for 10 days from symptom onset  Get plenty of rest and push fluids Use medications daily for symptom relief Use OTC medications like ibuprofen or tylenol as needed fever or pain Call or go to the ED if you have any new or worsening symptoms such as fever, worsening cough, shortness of breath, chest tightness, chest pain, turning blue, changes in mental status, etc..Marland Kitchen

## 2019-09-03 NOTE — ED Provider Notes (Signed)
[facility]    CSN: [id number] Arrival date & time: 09/03/19  1750      History   Chief Complaint Chief Complaint  Patient presents with  . URI    covid test    HPI Robyn Cook is a 18 y.o. female.   Robyn Cook is old female presented to the urgent care with complaint that she wants to be tested for COVID-19.  She is having tonsillectomy done on 12/16 and she needs to have two negative Covid test.  Denies sick exposure to flu or strep.  Denies recent travel.  Denies aggravating or alleviating symptoms.  Denies previous COVID infection.   Denies fever, chills, fatigue, nasal congestion, rhinorrhea, sore throat, SOB, wheezing, chest pain, nausea, vomiting, changes in bowel or bladder habits. COVID testing ordered.  It will take between 2-7 days for test results.  Someone will contact you regarding abnormal results.       The history is provided by the patient. No language interpreter was used.    Past Medical History:  Diagnosis Date  . Allergy to cats   . Asthma   . Eczema     Patient Active Problem List   Diagnosis Date Noted  . Allergic rhinitis 07/01/2013  . Asthma exacerbation 07/01/2013    History reviewed. No pertinent surgical history.  OB History   No obstetric history on file.      Home Medications    Prior to Admission medications   Medication Sig Start Date End Date Taking? Authorizing Provider  albuterol (PROVENTIL HFA;VENTOLIN HFA) 108 (90 Base) MCG/ACT inhaler Inhale 2 puffs into the lungs every 6 (six) hours as needed for wheezing. 02/20/18   Tysinger, Kermit Balo, PA-C  budesonide-formoterol (SYMBICORT) 80-4.5 MCG/ACT inhaler Inhale 2 puffs into the lungs 2 (two) times daily. 03/01/18   Tysinger, Kermit Balo, PA-C  Etonogestrel (NEXPLANON [place]) Inject into the skin.    [provider]  ibuprofen (ADVIL,MOTRIN) 200 MG tablet Take 800 mg by mouth every 6 (six) hours as needed for headache or mild pain.     [provider]  montelukast (SINGULAIR) 10 MG tablet Take 1 tablet (10 mg total) by mouth at bedtime. 02/20/18   Tysinger, Kermit Balo, PA-C  omeprazole (PRILOSEC) 40 MG capsule Take 1 capsule (40 mg total) by mouth daily. 02/20/18 04/15/19  Tysinger, Kermit Balo, PA-C    Family History Family History  Problem Relation Age of Onset  . Asthma Mother   . Hypertension Father     Social History Social History   Tobacco Use  . Smoking status: Passive Smoke Exposure - Never Smoker  . Smokeless tobacco: Never Used  Substance Use Topics  . Alcohol use: No  . Drug use: Never     Allergies   Latex   Review of Systems Review of Systems  Constitutional: Negative.   HENT: Negative.   Respiratory: Negative.   Cardiovascular: Negative.   Gastrointestinal: Negative.   Neurological: Negative.   ROS: All other are negatives   Physical Exam Triage Vital Signs ED Triage Vitals  Enc Vitals Group     BP      Pulse      Resp      Temp      Temp src      SpO2      Weight      Height      Head Circumference      Peak Flow      Pain  Score      Pain Loc      Pain Edu?      Excl. in [place]?    No data found.  Updated Vital Signs BP 122/77 (BP Location: Left Arm)   Pulse 71   Temp 99.8 F (37.7 C) (Oral)   Resp 14   LMP 09/03/2019   SpO2 100%   Visual Acuity Right Eye Distance:   Left Eye Distance:   Bilateral Distance:    Right Eye Near:   Left Eye Near:    Bilateral Near:     Physical Exam Vitals signs and nursing note reviewed.  Constitutional:      General: She is not in acute distress.    Appearance: Normal appearance. She is normal weight. She is not ill-appearing or toxic-appearing.  HENT:     Head: Normocephalic.     Right Ear: Tympanic membrane, ear canal and external ear normal. There is no impacted cerumen.     Left Ear: Tympanic membrane and external ear normal. There is no impacted cerumen.     Nose: Nose normal. No congestion.     Mouth/Throat:     Mouth:  Mucous membranes are moist.     Pharynx: Oropharynx is clear. No oropharyngeal exudate.  Cardiovascular:     Rate and Rhythm: Normal rate and regular rhythm.     Pulses: Normal pulses.     Heart sounds: Normal heart sounds. No murmur.  Pulmonary:     Effort: Pulmonary effort is normal. No respiratory distress.     Breath sounds: Normal breath sounds. No wheezing.  Chest:     Chest wall: No tenderness.  Neurological:     Mental Status: She is oriented to person, place, and time.      UC Treatments / Results  Labs (all labs ordered are listed, but only abnormal results are displayed) Labs Reviewed  NOVEL CORONAVIRUS, NAA (HOSP ORDER, SEND-OUT TO REF LAB; TAT 18-24 HRS)    EKG   Radiology No results found.  Procedures Procedures (including critical care time)  Medications Ordered in UC Medications - No data to display  Initial Impression / Assessment and Plan / UC Course  I have reviewed the triage vital signs and the nursing notes.  Pertinent labs & imaging results that were available during my care of the patient were reviewed by me and considered in my medical decision making (see chart for details).     Patient stable for discharge.  Benign physical exam.  COVID-19 test  was completed.  Will call patient if result is abnormal. Final Clinical Impressions(s) / UC Diagnoses   Final diagnoses:  Suspected COVID-19 virus infection     Discharge Instructions      Your COVID test result  will be available in 2 -7 days You should remain isolated in your home for 10 days from symptom onset  Get plenty of rest and push fluids Use medications daily for symptom relief Use OTC medications like ibuprofen or tylenol as needed fever or pain Call or go to the ED if you have any new or worsening symptoms such as fever, worsening cough, shortness of breath, chest tightness, chest pain, turning blue, changes in mental status, etc...    ED Prescriptions    None     PDMP  not reviewed this encounter.   Emerson Monte, FNP 09/03/19 1918

## 2019-09-03 NOTE — ED Triage Notes (Signed)
Pt here to get covid testing... reports co-worker tested positive  Pt is asymptomatic...  A&O x4... no acute distress... ambulatory

## 2019-09-05 LAB — NOVEL CORONAVIRUS, NAA (HOSP ORDER, SEND-OUT TO REF LAB; TAT 18-24 HRS): SARS-CoV-2, NAA: NOT DETECTED

## 2019-09-09 ENCOUNTER — Telehealth: Payer: Self-pay | Admitting: Medical

## 2019-09-09 MED ORDER — MONTELUKAST SODIUM 10 MG PO TABS: 10.0000 mg | ORAL_TABLET | Freq: Every day | ORAL | 3 refills | Status: DC

## 2019-09-09 MED ORDER — BUDESONIDE-FORMOTEROL FUMARATE 80-4.5 MCG/ACT IN AERO: 2.0000 | INHALATION_SPRAY | Freq: Two times a day (BID) | RESPIRATORY_TRACT | 3 refills | Status: DC

## 2019-09-09 MED ORDER — ALBUTEROL SULFATE HFA 108 (90 BASE) MCG/ACT IN AERS: 2.0000 | INHALATION_SPRAY | Freq: Four times a day (QID) | RESPIRATORY_TRACT | 0 refills | Status: DC | PRN

## 2019-09-09 NOTE — Telephone Encounter (Signed)
Pt called and stated that she is having surgery tomorrow and needs a refill on her Albuterol inhaler and her Symbicort inhaler and her Singulair sent to the [facility] on [address]

## 2019-09-17 ENCOUNTER — Telehealth: Payer: Self-pay

## 2019-09-17 ENCOUNTER — Ambulatory Visit: Payer: Self-pay

## 2019-09-17 ENCOUNTER — Other Ambulatory Visit: Payer: Self-pay | Admitting: Medical

## 2019-09-17 MED ORDER — POLYETHYLENE GLYCOL 3350 17 GM/SCOOP PO POWD: ORAL | 0 refills | Status: DC

## 2019-09-17 NOTE — Telephone Encounter (Signed)
Pt. Called stating she recently had surgery on her tonsils and was prescribed Oxycodone, pt. Is now constipated has not pooped in about 1 week, she tried taking magnesium citrate but couldn't drink it because it burned her throat so bad from where she had surgery. Is there anything else she can try to have a bowel movement, pt. Has stomach cramps and it is starting to hurt when she is walking, please advise.

## 2019-09-17 NOTE — Telephone Encounter (Signed)
I called and spoke to patient.  She took oxycodone post surgery, status post tonsillectomy has been backed up with constipation since.  Has not had a bowel movement in 1 week.  She usually has a daily bowel movement prior to this week.  She is drinking plenty of water.  I advised over-the-counter enema as a first step, explained this.  As a second measure I advised MiraLAX 1 capful per hour until she has a bowel movement sometime this evening  Advise she call tomorrow if no bowel movement  If much worse pain or severe pain of the belly and go to emergency department

## 2019-09-17 NOTE — Telephone Encounter (Signed)
Pt. Reports she had her tonsils out 1 week ago. Has not had a bowel movement since then. Has taken stool softener and MOM. States she is going to try Mad citrate. Instructed if that did not work, to call her PCP. Verbalizes understanding.  Reason for Disposition . Treating constipation with Over-The-Counter (OTC) medicines, questions about  Answer Assessment - Initial Assessment Questions 1. STOOL PATTERN OR FREQUENCY: "How often do you pass bowel movements (BMs)?"  (Normal range: tid to q 3 days)  "When was the last BM passed?"       Goes daily 2. STRAINING: "Do you have to strain to have a BM?"      No 3. RECTAL PAIN: "Does your rectum hurt when the stool comes out?" If so, ask: "Do you have hemorrhoids? How bad is the pain?"  (Scale 1-10; or mild, moderate, severe)     No 4. STOOL COMPOSITION: "Are the stools hard?"      No 5. BLOOD ON STOOLS: "Has there been any blood on the toilet tissue or on the surface of the BM?" If so, ask: "When was the last time?"      No 6. CHRONIC CONSTIPATION: "Is this a new problem for you?"  If no, ask: "How long have you had this problem?" (days, weeks, months)      No 7. CHANGES IN DIET: "Have there been any recent changes in your diet?"      No 8. MEDICATIONS: "Have you been taking any new medications?"     Pain medicine 9. LAXATIVES: "Have you been using any laxatives or enemas?"  If yes, ask "What, how often, and when was the last time?"     Stool softener  MOM 10. CAUSE: "What do you think is causing the constipation?"        Unsure 11. OTHER SYMPTOMS: "Do you have any other symptoms?" (e.g., abdominal pain, fever, vomiting)       Low abdominal 12. PREGNANCY: "Is there any chance you are pregnant?" "When was your last menstrual period?"       No  Protocols used: CONSTIPATION-A-AH

## 2019-09-17 NOTE — Telephone Encounter (Signed)
Pt called back and states that she is having pain with walking and cramps, states she has been drinking plenty of water, states the pain started today and has not gone to the bathroom in a week.   Pt called ER and they told her to call her PCP for instructions, states she has called her surgeon and can not get in touch with them. Please advise pt she can be reached at [phone]

## 2019-10-15 ENCOUNTER — Encounter (HOSPITAL_COMMUNITY): Payer: Self-pay

## 2019-10-15 ENCOUNTER — Other Ambulatory Visit: Payer: Self-pay

## 2019-10-15 ENCOUNTER — Ambulatory Visit (HOSPITAL_COMMUNITY)
Admission: EM | Admit: 2019-10-15 | Discharge: 2019-10-15 | Disposition: A | Discharge status: Home / Self Care | Payer: No Typology Code available for payment source | Attending: Urgent Care | Admitting: Urgent Care

## 2019-10-15 DIAGNOSIS — R0989 Other specified symptoms and signs involving the circulatory and respiratory systems: Secondary | ICD-10-CM | POA: Insufficient documentation

## 2019-10-15 DIAGNOSIS — Z20822 Contact with and (suspected) exposure to covid-19: Secondary | ICD-10-CM | POA: Insufficient documentation

## 2019-10-15 DIAGNOSIS — J4540 Moderate persistent asthma, uncomplicated: Secondary | ICD-10-CM | POA: Diagnosis not present

## 2019-10-15 DIAGNOSIS — R0789 Other chest pain: Secondary | ICD-10-CM | POA: Insufficient documentation

## 2019-10-15 DIAGNOSIS — R0602 Shortness of breath: Secondary | ICD-10-CM | POA: Insufficient documentation

## 2019-10-15 HISTORY — DX: Other seasonal allergic rhinitis: J30.2

## 2019-10-15 MED ORDER — PREDNISONE 20 MG PO TABS: ORAL_TABLET | ORAL | 0 refills | Status: DC

## 2019-10-15 NOTE — Discharge Instructions (Signed)
We will notify you of your COVID-19 test results as they arrive and may take between 2 to 7 days.  In the meantime, if you develop worsening symptoms including fever, chest pain, shortness of breath despite our current treatment plan then please report to the emergency room as this may be a sign of worsening status from possible COVID-19 infection.  We will manage this as a viral syndrome. For sore throat or cough try using a honey-based tea. Use 3 teaspoons of honey with juice squeezed from half lemon. Place shaved pieces of ginger into 1/2-1 cup of water and warm over stove top. Then mix the ingredients and repeat every 4 hours as needed. Please take Tylenol 500mg every 6 hours. Hydrate very well with at least 2 liters of water. Eat light meals such as soups to replenish electrolytes and soft fruits, veggies. Start an antihistamine like Zyrtec, Allegra or Claritin for postnasal drainage, sinus congestion.  You can take this together with pseudoephedrine (Sudafed) at a dose of 60 mg 3 times a day or twice daily as needed for the same kind of congestion.    

## 2019-10-15 NOTE — ED Provider Notes (Signed)
[facility]   MRN: [id number] DOB: 2001/08/06  Subjective:   Robyn Cook is a 19 y.o. female presenting for several day history of mild intermittent shortness of breath, chest tightness and general congestion.  Patient has a history of asthma, is using her albuterol inhaler consistently and Symbicort.  Does not need refills on these medications.  She would like to trial prednisone as she has done very well with this in the past with her suspect her asthma.  She did have Covid exposure to a coworker last week.  No current facility-administered medications for this encounter.  Current Outpatient Medications:  .  albuterol (VENTOLIN HFA) 108 (90 Base) MCG/ACT inhaler, Inhale 2 puffs into the lungs every 6 (six) hours as needed for wheezing., Disp: 18 g, Rfl: 0 .  budesonide-formoterol (SYMBICORT) 80-4.5 MCG/ACT inhaler, Inhale 2 puffs into the lungs 2 (two) times daily., Disp: 3 Inhaler, Rfl: 3 .  montelukast (SINGULAIR) 10 MG tablet, Take 1 tablet (10 mg total) by mouth at bedtime., Disp: 90 tablet, Rfl: 3 .  Etonogestrel (NEXPLANON [place]), Inject into the skin., Disp: , Rfl:  .  ibuprofen (ADVIL,MOTRIN) 200 MG tablet, Take 800 mg by mouth every 6 (six) hours as needed for headache or mild pain. , Disp: , Rfl:  .  polyethylene glycol powder (MIRALAX) 17 GM/SCOOP powder, 1 cap full per hour until BM, Disp: 255 g, Rfl: 0   Allergies  Allergen Reactions  . Latex Hives    Past Medical History:  Diagnosis Date  . Allergy to cats   . Asthma   . Eczema   . Seasonal allergic rhinitis      Past Surgical History:  Procedure Laterality Date  . TONSILLECTOMY      Family History  Problem Relation Age of Onset  . Asthma Mother   . Hypertension Father     Social History   Tobacco Use  . Smoking status: Passive Smoke Exposure - Never Smoker  . Smokeless tobacco: Never Used  Substance Use Topics  . Alcohol use: No  . Drug use: Never    Review of Systems  Constitutional:  Negative for fever and malaise/fatigue.  HENT: Negative for congestion, ear pain, sinus pain and sore throat.   Eyes: Negative for discharge and redness.  Respiratory: Positive for shortness of breath and wheezing. Negative for cough and hemoptysis.   Cardiovascular: Negative for chest pain.  Gastrointestinal: Negative for abdominal pain, diarrhea, nausea and vomiting.  Genitourinary: Negative for dysuria, flank pain and hematuria.  Musculoskeletal: Negative for myalgias.  Skin: Negative for rash.  Neurological: Negative for dizziness, weakness and headaches.  Psychiatric/Behavioral: Negative for depression and substance abuse.     Objective:   Vitals: BP 119/77 (BP Location: Left Arm)   Pulse 82   Temp 98.9 F (37.2 C) (Oral)   Resp 18   LMP 10/11/2019   SpO2 100%   Physical Exam Constitutional:      General: She is not in acute distress.    Appearance: Normal appearance. She is well-developed. She is obese. She is not ill-appearing, toxic-appearing or diaphoretic.  HENT:     Head: Normocephalic and atraumatic.     Nose: Nose normal.     Mouth/Throat:     Mouth: Mucous membranes are moist.  Eyes:     Extraocular Movements: Extraocular movements intact.     Pupils: Pupils are equal, round, and reactive to light.  Cardiovascular:     Rate and Rhythm: Normal rate and regular rhythm.  Pulses: Normal pulses.     Heart sounds: Normal heart sounds. No murmur. No friction rub. No gallop.   Pulmonary:     Effort: Pulmonary effort is normal. No respiratory distress.     Breath sounds: Normal breath sounds. No stridor. No wheezing, rhonchi or rales.  Skin:    General: Skin is warm and dry.     Findings: No rash.  Neurological:     Mental Status: She is alert and oriented to person, place, and time.  Psychiatric:        Mood and Affect: Mood normal.        Behavior: Behavior normal.        Thought Content: Thought content normal.      Assessment and Plan :   1.  Moderate persistent asthma without complication   2. Shortness of breath   3. Chest tightness   4. Chest congestion   5. Close exposure to COVID-19 virus     Will use prednisone course to address her asthma. Maintain all other medications. Will manage for viral illness such as viral URI, viral rhinitis, possible COVID-19. Counseled patient on nature of COVID-19 including modes of transmission, diagnostic testing, management and supportive care.  Offered symptomatic relief. COVID 19 testing is pending. Counseled patient on potential for adverse effects with medications prescribed/recommended today, ER and return-to-clinic precautions discussed, patient verbalized understanding.     Jaynee Eagles, [place] 10/15/19 1749

## 2019-10-15 NOTE — ED Triage Notes (Signed)
Pt c/o mild SOB, chest "congestion", HA, but has h/o seasonal allergies and asthma. Was exposed to co-worker on 10/08/19 who was covid positive. Tightness with breathing has not been improved with inhaler.  Denies fever, chills, abd pain, n/v/d.  Recently had tonsillectomy on 09/09/20 Able to speak full/long sentences w/o difficulty.

## 2019-10-16 LAB — NOVEL CORONAVIRUS, NAA (HOSP ORDER, SEND-OUT TO REF LAB; TAT 18-24 HRS): SARS-CoV-2, NAA: NOT DETECTED

## 2019-10-17 ENCOUNTER — Encounter: Payer: Self-pay | Admitting: Medical

## 2019-10-17 ENCOUNTER — Other Ambulatory Visit: Payer: Self-pay

## 2019-10-17 ENCOUNTER — Ambulatory Visit (INDEPENDENT_AMBULATORY_CARE_PROVIDER_SITE_OTHER): Payer: No Typology Code available for payment source | Admitting: Medical

## 2019-10-17 VITALS — Ht 59.0 in | Wt 189.0 lb

## 2019-10-17 DIAGNOSIS — R0789 Other chest pain: Secondary | ICD-10-CM | POA: Diagnosis not present

## 2019-10-17 DIAGNOSIS — J301 Allergic rhinitis due to pollen: Secondary | ICD-10-CM

## 2019-10-17 DIAGNOSIS — J45909 Unspecified asthma, uncomplicated: Secondary | ICD-10-CM | POA: Diagnosis not present

## 2019-10-17 DIAGNOSIS — F4329 Adjustment disorder with other symptoms: Secondary | ICD-10-CM | POA: Diagnosis not present

## 2019-10-17 NOTE — Progress Notes (Signed)
Subjective:     Patient ID: Robyn Cook, female   DOB: 2001-02-26, 19 y.o.   MRN: [id number]  This visit type was conducted due to national recommendations for restrictions regarding the COVID-19 Pandemic (e.g. social distancing) in an effort to limit this patient's exposure and mitigate transmission in our community.  Due to their co-morbid illnesses, this patient is at least at moderate risk for complications without adequate follow up.  This format is felt to be most appropriate for this patient at this time.    Documentation for virtual audio and video telecommunications through Zoom encounter:  The patient was located at home. The provider was located in the office. The patient did consent to this visit and is aware of possible charges through their insurance for this visit.  The other persons participating in this telemedicine service were none. Time spent on call was 20 minutes and in review of previous records 20 minutes total.  This virtual service is not related to other E/M service within previous 7 days.   HPI Chief Complaint  Patient presents with  . Asthma    -covid result   Virtual consult today for tightness in chest.  Has hx/o asthma.  Had tonsillectomy 09/09/2020.   Has had tightness in chest since surgery.   Talked to her surgeon, though the tightness could be due to 2 weeks of inactivity.  Getting some headache.  Yesterday had a sharp pain in chest.  Been feeling tightness in chest daily for the past week.  No cough, no runny nose, no congestion, no NVD, no body aches, no chills, no sinus pressure.  No fever.   Saw urgent care 10/15/19 for exposure.    At that time she was using symbicort and albuterol for asthma.   Was tested few days ago for covid.   Had covid + contact at work, but she tested negative 10/15/19.   Has hx/o asthma.  Has used inhaler some and it heps wheezing but doesn't take tightness away.   Cold air, particularly at night flares up her asthma.    Nonsmoker.    She is on birth control, on nexplanon.  No calve pain or swelling.  No recent long travel.  No hx/o DVT/PE.    No other aggravating or relieving factors. No other complaint.  Past Medical History:  Diagnosis Date  . Allergy to cats   . Asthma   . Eczema   . Seasonal allergic rhinitis    Current Outpatient Medications on File Prior to Visit  Medication Sig Dispense Refill  . albuterol (VENTOLIN HFA) 108 (90 Base) MCG/ACT inhaler Inhale 2 puffs into the lungs every 6 (six) hours as needed for wheezing. 18 g 0  . budesonide-formoterol (SYMBICORT) 80-4.5 MCG/ACT inhaler Inhale 2 puffs into the lungs 2 (two) times daily. 3 Inhaler 3  . Etonogestrel (NEXPLANON [place]) Inject into the skin.    . montelukast (SINGULAIR) 10 MG tablet Take 1 tablet (10 mg total) by mouth at bedtime. 90 tablet 3  . ibuprofen (ADVIL,MOTRIN) 200 MG tablet Take 800 mg by mouth every 6 (six) hours as needed for headache or mild pain.     . polyethylene glycol powder (MIRALAX) 17 GM/SCOOP powder 1 cap full per hour until BM (Patient not taking: Reported on 10/17/2019) 255 g 0  . predniSONE (DELTASONE) 20 MG tablet Take 2 tablets daily with breakfast. (Patient not taking: Reported on 10/17/2019) 10 tablet 0  . [DISCONTINUED] omeprazole (PRILOSEC) 40 MG capsule Take 1 capsule (  40 mg total) by mouth daily. 30 capsule 0   No current facility-administered medications on file prior to visit.     Review of Systems As in subjective    Objective:   Physical Exam Due to coronavirus pandemic stay at home measures, patient visit was virtual and they were not examined in person.   Ht 4\' 11"  (1.499 m)   Wt 189 lb (85.7 kg)   LMP 10/11/2019   BMI 38.17 kg/m      Assessment:     Encounter Diagnoses  Name Primary?  . Chest tightness Yes  . Moderate asthma without complication, unspecified whether persistent   . Allergic rhinitis due to pollen, unspecified seasonality   . Stress and adjustment reaction         Plan:     We discussed her concerns.  I reviewed her urgent care notes from 2 days ago.  Her vital signs are stable and then.  Her exam findings were reviewed.  She denies any calf pain or tenderness, no tachycardia.  Overall she sounds low risk for DVT based on these findings.  She has no known heart issues.  She denies any recent GERD symptoms or indigestion.  Thus I would attribute her symptoms to either asthma or possibly a little bit of anxiety.  She will continue Symbicort twice daily, she will continue albuterol but I asked her to use albuterol at least twice a day at least at bedtime the next several months.  She had not been using albuterol at bedtime and that is when some of her symptoms are worse.  We discussed hydration, healthy diet.  We discussed doing with stress, coping skills.  If she is able to get transportation she may come in today in person for vital signs and in person physical exam based on her symptoms.  If not then she will continue recommendations above and call back in 2 or 3 days to let me know how she is feeling  I did tell her to go ahead and begin the prednisone prescribed by urgent care 2 days ago.  She had not yet started this.  She was Covid negative a few days ago.  She was tested due to a positive contact.  Test was reviewed in the chart record.  Robyn Cook was seen today for asthma.  Diagnoses and all orders for this visit:  Chest tightness  Moderate asthma without complication, unspecified whether persistent  Allergic rhinitis due to pollen, unspecified seasonality  Stress and adjustment reaction

## 2019-10-22 ENCOUNTER — Telehealth: Payer: Self-pay | Admitting: Medical

## 2019-10-22 NOTE — Telephone Encounter (Signed)
At this point needs to be seen in person for vitals, exam, likely EKG, pulse ox.

## 2019-10-22 NOTE — Telephone Encounter (Signed)
Sent message on mychart for patient to call and schedule her appointment

## 2019-10-24 ENCOUNTER — Encounter: Payer: Self-pay | Admitting: Medical

## 2019-10-24 ENCOUNTER — Ambulatory Visit
Admission: RE | Admit: 2019-10-24 | Discharge: 2019-10-24 | Disposition: A | Discharge status: Home / Self Care | Payer: No Typology Code available for payment source | Source: Ambulatory Visit | Attending: Medical | Admitting: Medical

## 2019-10-24 ENCOUNTER — Ambulatory Visit (INDEPENDENT_AMBULATORY_CARE_PROVIDER_SITE_OTHER): Payer: No Typology Code available for payment source | Admitting: Medical

## 2019-10-24 ENCOUNTER — Other Ambulatory Visit: Payer: Self-pay

## 2019-10-24 VITALS — BP 112/82 | HR 106 | Temp 98.0°F | Ht 59.0 in | Wt 198.2 lb

## 2019-10-24 DIAGNOSIS — J4541 Moderate persistent asthma with (acute) exacerbation: Secondary | ICD-10-CM

## 2019-10-24 DIAGNOSIS — R000 Tachycardia, unspecified: Secondary | ICD-10-CM | POA: Insufficient documentation

## 2019-10-24 DIAGNOSIS — R0602 Shortness of breath: Secondary | ICD-10-CM

## 2019-10-24 DIAGNOSIS — R0789 Other chest pain: Secondary | ICD-10-CM | POA: Diagnosis not present

## 2019-10-24 MED ORDER — OMEPRAZOLE 40 MG PO CPDR: 40.0000 mg | DELAYED_RELEASE_CAPSULE | Freq: Every day | ORAL | 0 refills | Status: DC

## 2019-10-24 NOTE — Patient Instructions (Signed)
Her symptoms include chest pain, chest tightness, upper belly pain.  I suspect the asthma is still the main source of your issues right now.   Recommendations:  Continue Symbicort prevention inhaler 1 to 2 puffs twice daily  Continue albuterol but do at least 2 puffs twice daily, you can use this every 4-6 hours as needed  Finish out the prednisone that was prescribed by urgent care as this should help with chest discomfort, breathing  Begin omeprazole for acid reflux in case this is causing some of the symptoms as well  Drink plenty of water throughout the day  Please go to [facility] for your chest xray.   Their hours are 8am - 4:30 pm Monday - Friday.  Take your insurance card with you.  [facility] [phone]  [address], [address] [place], [place] [address]  [address] [place], [place] [address]

## 2019-10-24 NOTE — Progress Notes (Signed)
Subjective:     Patient ID: Robyn Cook, female   DOB: 06/12/2001, 19 y.o.   MRN: [id number]  HPI Chief Complaint  Patient presents with  . Chest Pain    started after tonsil surgery    Here for in person visit for chest pain.  I did a virtual visit with her about a week ago.  She still has symptoms.  Wanted to come in for exam, vitals and in person evaluation.  At her recent virtual consult, she noted that she had a tonsillectomy 09/09/2020.   Has had tightness in chest since surgery.  She does have a history of asthma.  Talked to her surgeon, though the tightness could be due to 2 weeks of inactivity.    Recent symptoms include headache, sharp pain in the chest, feeling tightness in chest daily for the past 2 week.  No cough, no runny nose, no congestion, no NVD, no body aches, no chills, no sinus pressure.  No fever.   Saw urgent care 10/15/19 for exposure.    At that time she was using Symbicort and albuterol for asthma.   Was tested last week for covid.   Had covid + contact at work, but she tested negative 10/15/19.  Cold air, particularly at night flares up her asthma.   Nonsmoker.    She is on birth control, on nexplanon.  No calve pain or swelling.  No recent long travel.  No hx/o DVT/PE.    No other aggravating or relieving factors. No other complaint.  Past Medical History:  Diagnosis Date  . Allergy to cats   . Asthma   . Eczema   . Seasonal allergic rhinitis    Current Outpatient Medications on File Prior to Visit  Medication Sig Dispense Refill  . albuterol (VENTOLIN HFA) 108 (90 Base) MCG/ACT inhaler Inhale 2 puffs into the lungs every 6 (six) hours as needed for wheezing. 18 g 0  . budesonide-formoterol (SYMBICORT) 80-4.5 MCG/ACT inhaler Inhale 2 puffs into the lungs 2 (two) times daily. 3 Inhaler 3  . Etonogestrel (NEXPLANON [place]) Inject into the skin.    . montelukast (SINGULAIR) 10 MG tablet Take 1 tablet (10 mg total) by mouth at bedtime. 90 tablet 3   No  current facility-administered medications on file prior to visit.     Review of Systems  Cardiovascular: Positive for chest pain.   As in subjective    Objective:   Physical Exam BP 112/82   Pulse (!) 106   Temp 98 F (36.7 C)   Ht 4\' 11"  (1.499 m)   Wt 198 lb 3.2 oz (89.9 kg)   LMP 10/11/2019   SpO2 97%   BMI 40.03 kg/m    General appearence: alert, no distress, WD/WN, obese African American female HEENT: normocephalic, sclerae anicteric, TMs pearly, nares patent, no discharge or erythema, pharynx normal Oral cavity: MMM, no lesions Neck: supple, no lymphadenopathy, no thyromegaly, no masses Heart: mildly tachycardic, otherwise RRR, normal S1, S2, no murmurs Lungs: decreased lower field sounds, otherwise no wheezes, rhonchi, or rales Abdomen: +bs, soft, non tender, non distended, no masses, no hepatomegaly, no splenomegaly Pulses: 2+ symmetric, upper and lower extremities, normal cap refill Ext: no edema, no calf tenderness, no asymmetry, negative Homans   EKG reviewed     Assessment:     Encounter Diagnoses  Name Primary?  . Chest tightness Yes  . Tachycardia   . SOB (shortness of breath)   . Moderate persistent asthma with exacerbation  Plan:     We discussed her concerns.  I reviewed her urgent care notes from recent visit.  She denies any calf pain or tenderness, overall she sounds low risk for DVT based on these findings.  She has no known heart issues.  She may have some  GERD given epigastric tendnerss and eating acid foods.   Thus I would attribute her symptoms to either asthma or possibly a little bit of anxiety or GERD as well.  Nevertheless, given tachycardia on prior visit and this visit, reviewed EKG, will get some baseline labs and CXR.    She will continue Symbicort twice daily, she will continue albuterol but I asked her to use albuterol at least twice a day at least at bedtime the next several months.  We discussed hydration, healthy diet.   We discussed doing with stress, coping skills.  She was Covid negative last week.  She was tested due to a positive contact.  Test was reviewed in the chart record.  Patient Instructions  Her symptoms include chest pain, chest tightness, upper belly pain.  I suspect the asthma is still the main source of your issues right now.   Recommendations:  Continue Symbicort prevention inhaler 1 to 2 puffs twice daily  Continue albuterol but do at least 2 puffs twice daily, you can use this every 4-6 hours as needed  Finish out the prednisone that was prescribed by urgent care as this should help with chest discomfort, breathing  Begin omeprazole for acid reflux in case this is causing some of the symptoms as well  Drink plenty of water throughout the day  Please go to [facility] for your chest xray.   Their hours are 8am - 4:30 pm Monday - Friday.  Take your insurance card with you.  [facility] [phone]  [address], [place], [address] [address]  [address], [place] [address]    Robyn Cook was seen today for chest pain.  Diagnoses and all orders for this visit:  Chest tightness -     DG Chest 2 View; Future -     EKG 12-Lead -     Basic metabolic panel -     CBC with Differential/Platelet -     TSH  Tachycardia -     DG Chest 2 View; Future -     EKG 12-Lead -     Basic metabolic panel -     CBC with Differential/Platelet -     TSH  SOB (shortness of breath) -     DG Chest 2 View; Future -     EKG 12-Lead -     Basic metabolic panel -     CBC with Differential/Platelet -     TSH  Moderate persistent asthma with exacerbation -     DG Chest 2 View; Future -     EKG 12-Lead -     Basic metabolic panel -     CBC with Differential/Platelet -     TSH  Other orders -     omeprazole (PRILOSEC) 40 MG capsule; Take 1 capsule (40 mg total) by mouth daily.

## 2019-10-25 LAB — CBC WITH DIFFERENTIAL/PLATELET
Basophils Absolute: 0 10*3/uL (ref 0.0–0.2)
Basos: 0 %
EOS (ABSOLUTE): 0.3 10*3/uL (ref 0.0–0.4)
Eos: 4 %
Hematocrit: 35.4 % (ref 34.0–46.6)
Hemoglobin: 11.8 g/dL (ref 11.1–15.9)
Immature Grans (Abs): 0 10*3/uL (ref 0.0–0.1)
Immature Granulocytes: 0 %
Lymphocytes Absolute: 1.9 10*3/uL (ref 0.7–3.1)
Lymphs: 31 %
MCH: 30.6 pg (ref 26.6–33.0)
MCHC: 33.3 g/dL (ref 31.5–35.7)
MCV: 92 fL (ref 79–97)
Monocytes Absolute: 0.6 10*3/uL (ref 0.1–0.9)
Monocytes: 10 %
Neutrophils Absolute: 3.4 10*3/uL (ref 1.4–7.0)
Neutrophils: 55 %
Platelets: 256 10*3/uL (ref 150–450)
RBC: 3.86 x10E6/uL (ref 3.77–5.28)
RDW: 11.8 % (ref 11.7–15.4)
WBC: 6.2 10*3/uL (ref 3.4–10.8)

## 2019-10-25 LAB — BASIC METABOLIC PANEL
BUN/Creatinine Ratio: 20 (ref 9–23)
BUN: 13 mg/dL (ref 6–20)
CO2: 22 mmol/L (ref 20–29)
Calcium: 9.1 mg/dL (ref 8.7–10.2)
Chloride: 104 mmol/L (ref 96–106)
Creatinine, Ser: 0.66 mg/dL (ref 0.57–1.00)
GFR calc Af Amer: 147 mL/min/{1.73_m2} (ref >59)
GFR calc non Af Amer: 128 mL/min/{1.73_m2} (ref >59)
Glucose: 75 mg/dL (ref 65–99)
Potassium: 4.2 mmol/L (ref 3.5–5.2)
Sodium: 140 mmol/L (ref 134–144)

## 2019-10-25 LAB — TSH: TSH: 3.88 u[IU]/mL (ref 0.450–4.500)

## 2020-01-12 ENCOUNTER — Other Ambulatory Visit: Payer: Self-pay | Admitting: Medical

## 2020-01-12 MED ORDER — MOMETASONE FURO-FORMOTEROL FUM 100-5 MCG/ACT IN AERO: 2.0000 | INHALATION_SPRAY | Freq: Two times a day (BID) | RESPIRATORY_TRACT | 5 refills | Status: DC

## 2020-01-13 ENCOUNTER — Telehealth: Payer: Self-pay

## 2020-01-13 NOTE — Telephone Encounter (Signed)
I submitted a PA for the pts. dulera and it was approved from 01/12/20-01/11/21.

## 2020-01-28 ENCOUNTER — Telehealth: Payer: Self-pay | Admitting: Medical

## 2020-01-28 NOTE — Telephone Encounter (Signed)
  Patient would like to have someone call her concerning possible reaction to Covid vaccine  5/3 received #2 covid vaccine She noticed it itching today and she has a little knot there And it does feel slightly warm

## 2020-01-28 NOTE — Telephone Encounter (Signed)
Patient stated it is just a knot but she will use compressions and let us know if it worsens.

## 2020-01-28 NOTE — Telephone Encounter (Signed)
Please call and get more info.  Its not unusual to have some discomfort, warmth, localized pink/red coloration or even a knot after vaccines.  If that is all she has, use warm or cool compress 10-15 minutes at a time.  If worse symptom or other, may need appt or let me know concerns

## 2020-08-16 ENCOUNTER — Emergency Department (HOSPITAL_COMMUNITY)
Admission: EM | Admit: 2020-08-16 | Discharge: 2020-08-16 | Disposition: A | Discharge status: Home / Self Care | Payer: No Typology Code available for payment source | Attending: Emergency Medicine | Admitting: Emergency Medicine

## 2020-08-16 ENCOUNTER — Other Ambulatory Visit: Payer: Self-pay

## 2020-08-16 ENCOUNTER — Encounter (HOSPITAL_COMMUNITY): Payer: Self-pay | Admitting: Emergency Medicine

## 2020-08-16 DIAGNOSIS — Y92810 Car as the place of occurrence of the external cause: Secondary | ICD-10-CM | POA: Diagnosis not present

## 2020-08-16 DIAGNOSIS — S6990XA Unspecified injury of unspecified wrist, hand and finger(s), initial encounter: Secondary | ICD-10-CM | POA: Diagnosis not present

## 2020-08-16 DIAGNOSIS — Z5321 Procedure and treatment not carried out due to patient leaving prior to being seen by health care provider: Secondary | ICD-10-CM | POA: Diagnosis not present

## 2020-08-16 DIAGNOSIS — W231XXA Caught, crushed, jammed, or pinched between stationary objects, initial encounter: Secondary | ICD-10-CM | POA: Diagnosis not present

## 2020-08-16 NOTE — ED Triage Notes (Signed)
Pt. Stated, I slammed my rt. Thumb in car door.

## 2020-10-20 ENCOUNTER — Other Ambulatory Visit: Payer: Self-pay | Admitting: Medical

## 2020-10-20 ENCOUNTER — Telehealth: Payer: Self-pay | Admitting: Family Medicine

## 2020-10-20 MED ORDER — ALBUTEROL SULFATE HFA 108 (90 BASE) MCG/ACT IN AERS: 2.0000 | INHALATION_SPRAY | Freq: Four times a day (QID) | RESPIRATORY_TRACT | 0 refills | Status: DC | PRN

## 2020-10-20 NOTE — Telephone Encounter (Signed)
Pt called she is out of inhaler and needs it now.  I sched her a med check for 2/1. She states you know which inhaler her insurance pays for as you just gave her mom the one her insurance pays for. She is out of Albuterol.  Please advise pt [phone]

## 2020-10-20 NOTE — Telephone Encounter (Signed)
[facility] on [address] in [facility]

## 2020-10-20 NOTE — Telephone Encounter (Signed)
Albuterol sent

## 2020-10-26 ENCOUNTER — Encounter: Payer: No Typology Code available for payment source | Admitting: Medical

## 2020-11-08 ENCOUNTER — Other Ambulatory Visit: Payer: Self-pay | Admitting: Medical

## 2020-11-09 NOTE — Telephone Encounter (Signed)
I received an albuterol refill request.  It would appear in the chart record that she is changed to a different family practice and has had several emergency department visits since last visit here  So I would recommend a visit to discuss

## 2020-11-12 IMAGING — CR DG CHEST 2V
2 series · 2 of 2 positions shown · non-contrast
Comparison: None.

CLINICAL DATA: Shortness of breath and chest tightness

EXAM:
CHEST - 2 VIEW

[w chest pa]
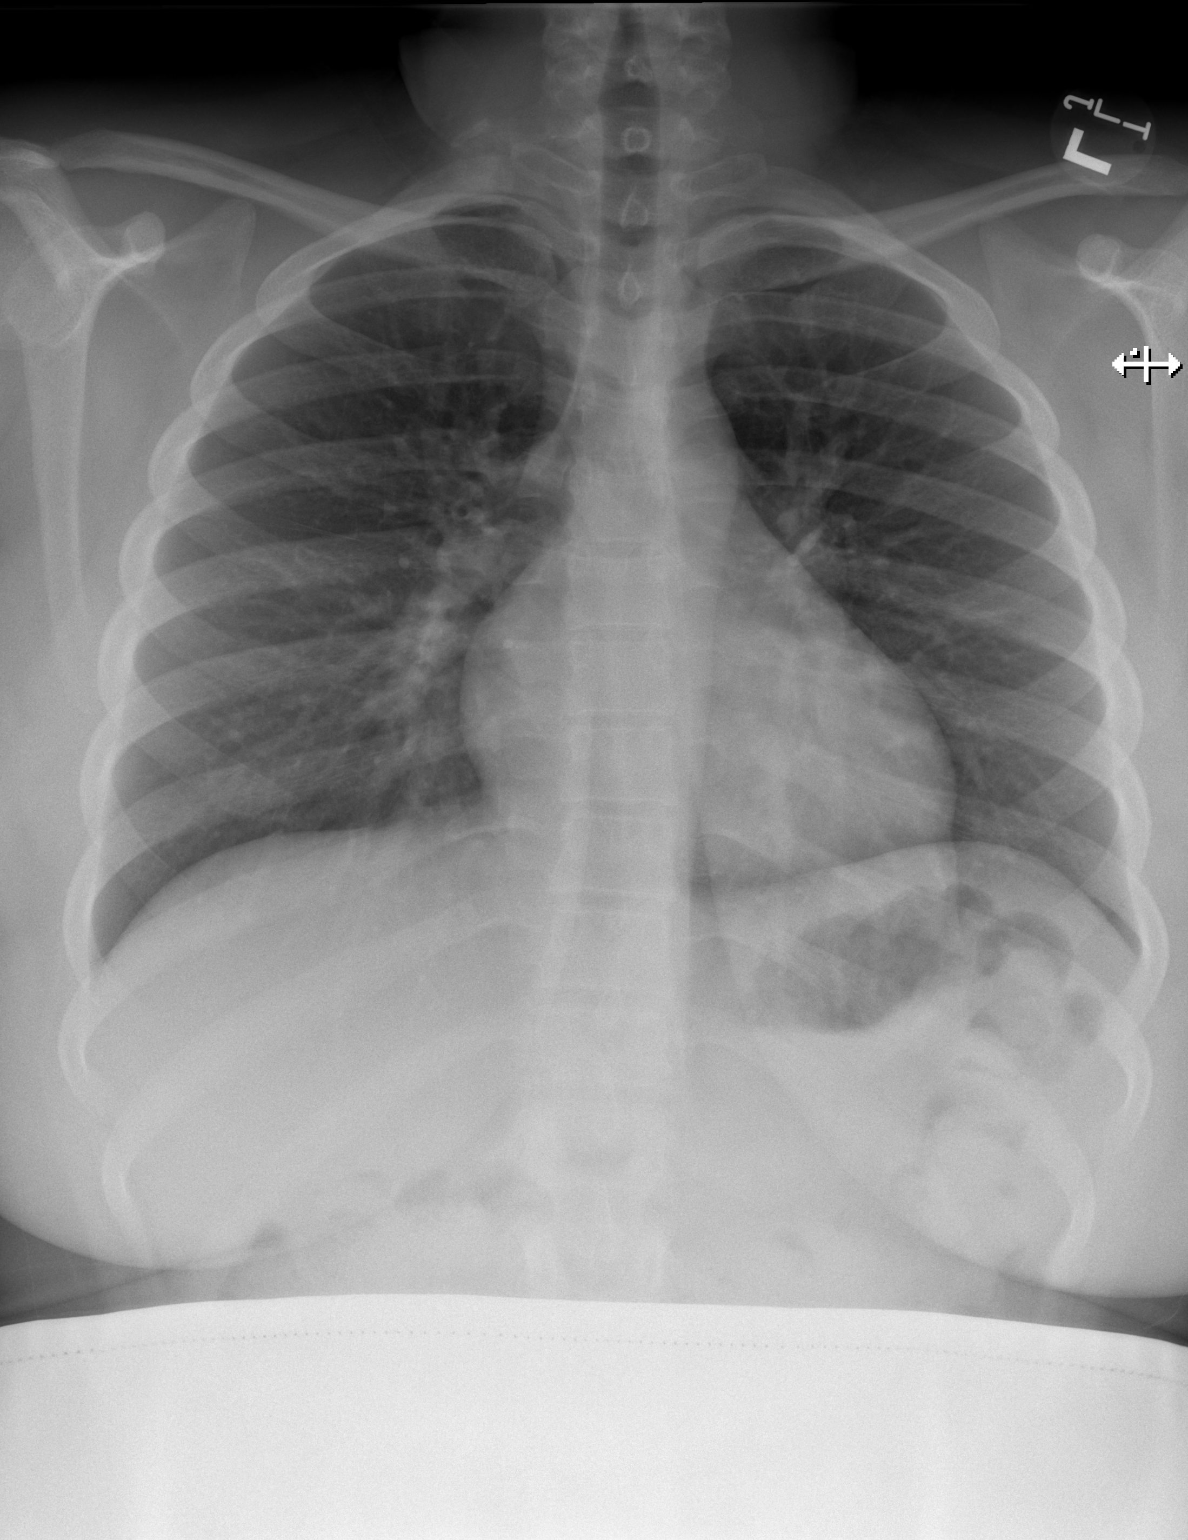

[w chest lat]
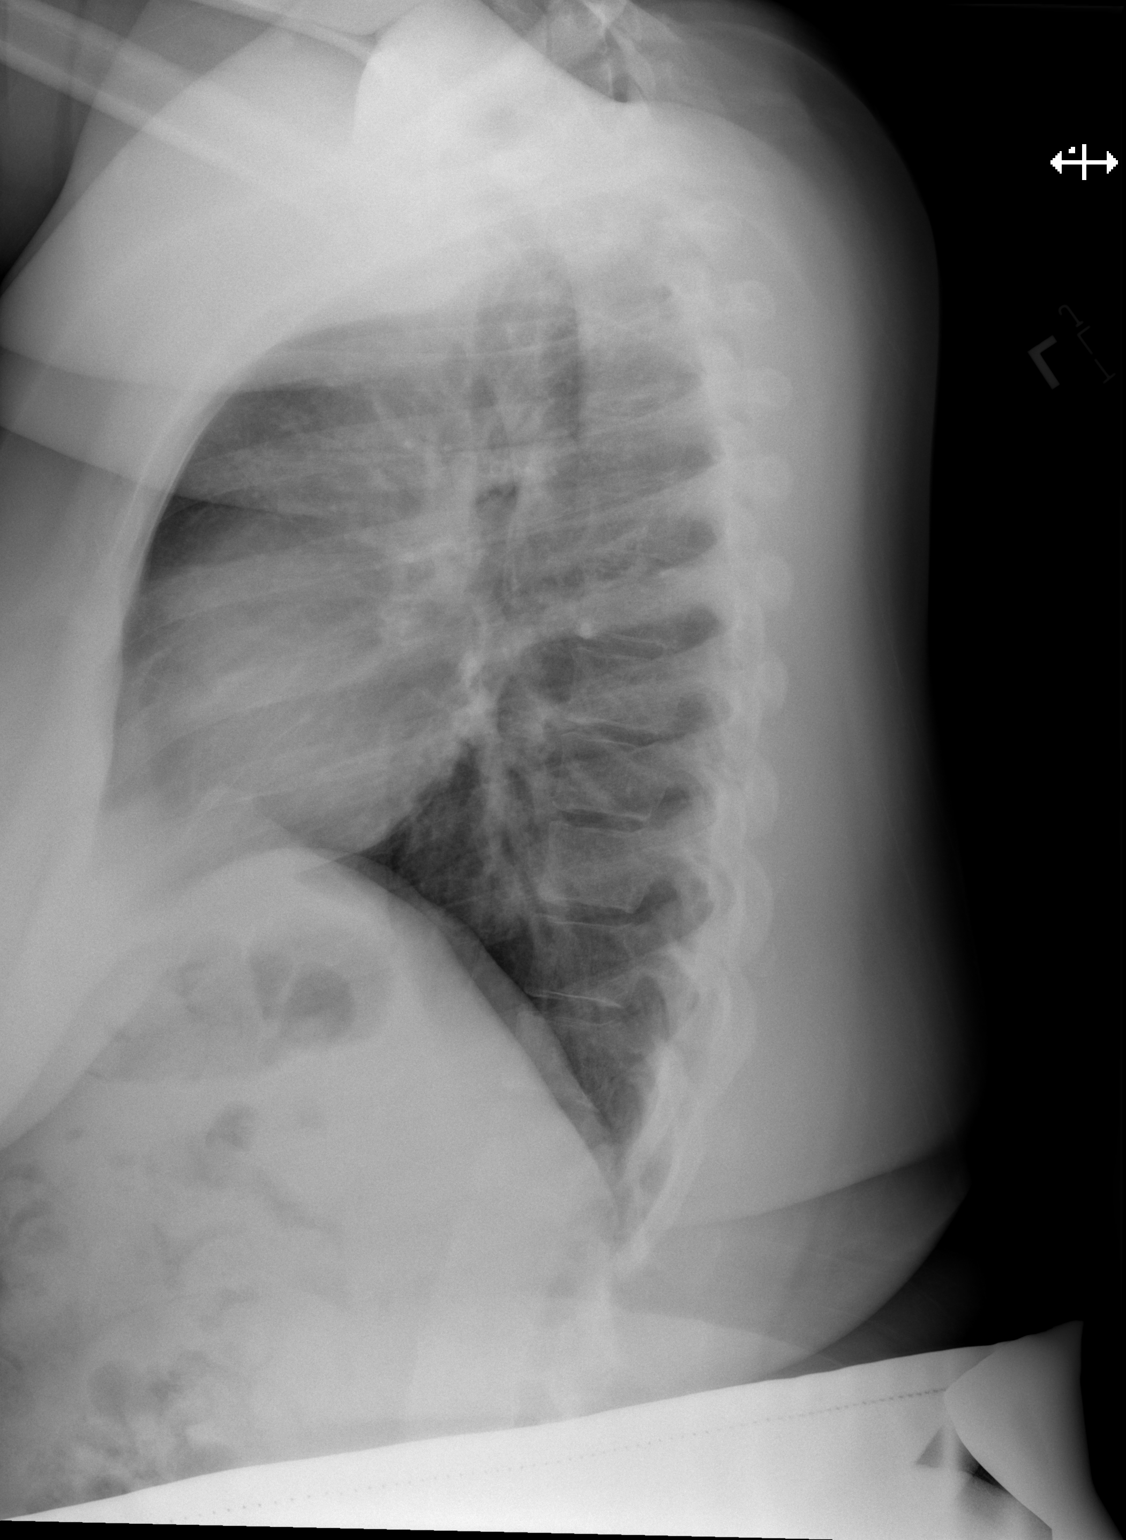

[2 of 2 positions shown; findings below may reference images not displayed]

FINDINGS: Lungs are clear. Heart size and pulmonary vascularity are normal. No
adenopathy. No pneumothorax. No bone lesions.
IMPRESSION: No abnormality noted.

## 2020-12-14 LAB — HM PAP SMEAR: HM Pap smear: NORMAL

## 2021-01-04 ENCOUNTER — Telehealth (INDEPENDENT_AMBULATORY_CARE_PROVIDER_SITE_OTHER): Payer: No Typology Code available for payment source | Admitting: Medical

## 2021-01-04 ENCOUNTER — Other Ambulatory Visit: Payer: Self-pay

## 2021-01-04 ENCOUNTER — Encounter: Payer: Self-pay | Admitting: Medical

## 2021-01-04 VITALS — Temp 98.1°F | Wt 190.2 lb

## 2021-01-04 DIAGNOSIS — J45909 Unspecified asthma, uncomplicated: Secondary | ICD-10-CM

## 2021-01-04 DIAGNOSIS — J301 Allergic rhinitis due to pollen: Secondary | ICD-10-CM

## 2021-01-04 DIAGNOSIS — Z3A16 16 weeks gestation of pregnancy: Secondary | ICD-10-CM

## 2021-01-04 MED ORDER — ALBUTEROL SULFATE HFA 108 (90 BASE) MCG/ACT IN AERS: 2.0000 | INHALATION_SPRAY | Freq: Four times a day (QID) | RESPIRATORY_TRACT | 1 refills | Status: DC | PRN

## 2021-01-04 MED ORDER — BUDESONIDE-FORMOTEROL FUMARATE 80-4.5 MCG/ACT IN AERO: 2.0000 | INHALATION_SPRAY | Freq: Two times a day (BID) | RESPIRATORY_TRACT | 3 refills | Status: DC

## 2021-01-04 MED ORDER — MONTELUKAST SODIUM 10 MG PO TABS: 10.0000 mg | ORAL_TABLET | Freq: Every day | ORAL | 3 refills | Status: DC

## 2021-01-04 NOTE — Progress Notes (Signed)
  Subjective:     Patient ID: Robyn Cook, female   DOB: April 17, 2001, 20 y.o.   MRN: [id number]  This visit type was conducted due to national recommendations for restrictions regarding the COVID-19 Pandemic (e.g. social distancing) in an effort to limit this patient's exposure and mitigate transmission in our community.  Due to their co-morbid illnesses, this patient is at least at moderate risk for complications without adequate follow up.  This format is felt to be most appropriate for this patient at this time.    Documentation for virtual audio and video telecommunications through [place] encounter:  The patient was located at home. The provider was located in the office. The patient did consent to this visit and is aware of possible charges through their insurance for this visit.  The other persons participating in this telemedicine service were none. Time spent on call was 20 minutes and in review of previous records 20 minutes total.  This virtual service is not related to other E/M service within previous 7 days.   HPI Chief Complaint  Patient presents with  . Asthma    Asthma fare up x1 week. Sneezing, runny nose, congestion with blood.    Virtual consult for asthma flare.  Worse in past 1 weeks with all the pollen.   Using albuterol more than normal.  Having some congestion, sneezing all day long.  She recently ran out of Singulair and Symbicort.   Uses emergency albuterol about once daily.    Is currently pregnant, 16 weeks.  Sees [facility] [place]    No other symptoms, no fever, no chest pain, no nausea, vomiting, diarrhea or sick contacts.    Past Medical History:  Diagnosis Date  . Allergy to cats   . Asthma   . Eczema   . Seasonal allergic rhinitis      Review of Systems As in subjective    Objective:   Physical Exam Due to coronavirus pandemic stay at home measures, patient visit was virtual and they were not examined in person.    Gen: wd, wn, nad No labored breathing     Assessment:     Encounter Diagnoses  Name Primary?  . Allergic rhinitis due to pollen, unspecified seasonality Yes  . Moderate asthma without complication, unspecified whether persistent   . [redacted] weeks gestation of pregnancy        Plan:     asthma exacerbation due to pollen.  Add back /refill Singulair, Symbicort.   C/t albuterol but can use q4-6 hours prn.  Discussed difference between rescue and prevention inhalers.  She will call her obstetrician to make sure they are aware of her regimen in case we need to change to inhaled steroid only or other regimen.   If not improving in the next week, then call back  Yamaris was seen today for asthma.  Diagnoses and all orders for this visit:  Allergic rhinitis due to pollen, unspecified seasonality  Moderate asthma without complication, unspecified whether persistent  [redacted] weeks gestation of pregnancy  Other orders -     montelukast (SINGULAIR) 10 MG tablet; Take 1 tablet (10 mg total) by mouth at bedtime. -     albuterol (VENTOLIN HFA) 108 (90 Base) MCG/ACT inhaler; Inhale 2 puffs into the lungs every 6 (six) hours as needed for wheezing. -     budesonide-formoterol (SYMBICORT) 80-4.5 MCG/ACT inhaler; Inhale 2 puffs into the lungs 2 (two) times daily.  f/u 1-2 weeks if not improving.

## 2021-01-06 ENCOUNTER — Encounter: Payer: Self-pay | Admitting: Medical

## 2021-01-06 ENCOUNTER — Telehealth: Payer: Self-pay | Admitting: Medical

## 2021-01-06 NOTE — Telephone Encounter (Signed)
Pt called and states that she needs a work note for Tuesday. She states she had an appt that day. She ALSO is requesting a note for today stating she Asthma is flareing up today. Please advise pt at [phone].

## 2021-01-06 NOTE — Telephone Encounter (Signed)
Ok, note is fine

## 2021-03-10 ENCOUNTER — Telehealth (INDEPENDENT_AMBULATORY_CARE_PROVIDER_SITE_OTHER): Payer: No Typology Code available for payment source | Admitting: Medical

## 2021-03-10 ENCOUNTER — Encounter: Payer: Self-pay | Admitting: Medical

## 2021-03-10 ENCOUNTER — Other Ambulatory Visit: Payer: Self-pay

## 2021-03-10 VITALS — Ht <= 58 in | Wt 210.0 lb

## 2021-03-10 DIAGNOSIS — Z3A25 25 weeks gestation of pregnancy: Secondary | ICD-10-CM | POA: Insufficient documentation

## 2021-03-10 DIAGNOSIS — J309 Allergic rhinitis, unspecified: Secondary | ICD-10-CM | POA: Diagnosis not present

## 2021-03-10 DIAGNOSIS — J4541 Moderate persistent asthma with (acute) exacerbation: Secondary | ICD-10-CM

## 2021-03-10 MED ORDER — ALBUTEROL SULFATE HFA 108 (90 BASE) MCG/ACT IN AERS: 2.0000 | INHALATION_SPRAY | Freq: Four times a day (QID) | RESPIRATORY_TRACT | 1 refills | Status: DC | PRN

## 2021-03-10 MED ORDER — BUDESONIDE-FORMOTEROL FUMARATE 160-4.5 MCG/ACT IN AERO: 2.0000 | INHALATION_SPRAY | Freq: Two times a day (BID) | RESPIRATORY_TRACT | 11 refills | Status: DC

## 2021-03-10 MED ORDER — AMOXICILLIN 875 MG PO TABS: 875.0000 mg | ORAL_TABLET | Freq: Two times a day (BID) | ORAL | 0 refills | Status: AC

## 2021-03-10 NOTE — Progress Notes (Signed)
Subjective:     Patient ID: Robyn Cook, female   DOB: November 26, 2000, 20 y.o.   MRN: [id number]  This visit type was conducted due to national recommendations for restrictions regarding the COVID-19 Pandemic (e.g. social distancing) in an effort to limit this patient's exposure and mitigate transmission in our community.  Due to their co-morbid illnesses, this patient is at least at moderate risk for complications without adequate follow up.  This format is felt to be most appropriate for this patient at this time.    Documentation for virtual audio and video telecommunications through [place] encounter:  The patient was located at home. The provider was located in the office. The patient did consent to this visit and is aware of possible charges through their insurance for this visit.  The other persons participating in this telemedicine service were none. Time spent on call was 20 minutes and in review of previous records 20 minutes total.  This virtual service is not related to other E/M service within previous 7 days.   HPI Chief Complaint  Patient presents with   Follow-up    Asthma attack    Virtual consult for asthma flare.  She went to ED yesterday for asthma flare up.  Was given prednisone and nebulized breathing treatment while at the emergency dept   The chest tightness and wheezing is better today compared to yesterday.   No fever, but feels tired and weak, some shaking.  Been taking Albuterol several times per day this past several days.  Not current taking Symbicort or Dulera that was listed in chart, doesn't have any at home.   Is taking prednisone prescribed yesterday as well as some short term potassium given low reading yesterday.    She is taking Singulair daily.  She is currently pregnant, 25 weeks and 5 days gestation.  In the past 6-12 months, gets flare ups of asthma more regularly  Does have some congestion in ears and head, concerned about sinus  infection given the mucous.   Has used neb set up at home prior.   Past Medical History:  Diagnosis Date   Allergy to cats    Asthma    Eczema    Seasonal allergic rhinitis    Current Outpatient Medications on File Prior to Visit  Medication Sig Dispense Refill   Etonogestrel (NEXPLANON [place]) Inject into the skin.     montelukast (SINGULAIR) 10 MG tablet Take 1 tablet (10 mg total) by mouth at bedtime. 90 tablet 3   omeprazole (PRILOSEC) 40 MG capsule Take 1 capsule (40 mg total) by mouth daily. 30 capsule 0   predniSONE (DELTASONE) 20 MG tablet Take 1 with breakfast and 1 with lunch for 4 days.     No current facility-administered medications on file prior to visit.    Review of Systems As in subjective    Objective:   Physical Exam Due to coronavirus pandemic stay at home measures, patient visit was virtual and they were not examined in person.   Ht '4\' 9"'  (1.448 m)   Wt 210 lb (95.3 kg)   LMP 08/14/2020   BMI 45.44 kg/m   Gen: wd, wn, nad, African American Congested sounding, somewhat hoarse voice No obvious wheezing or SOB      Assessment:     Encounter Diagnoses  Name Primary?   Moderate persistent asthma with exacerbation Yes   Allergic rhinitis, unspecified seasonality, unspecified trigger    [redacted] weeks gestation of pregnancy  Plan:     Reviewed her recent emergency department note, findings, labs.    Add back Symbicort, c/t Albuterol for rescue, either handheld or neb.  We will get her set up for neb kit at home.    Begin amox for sinuitis, hydrate well, rest.    Referral to allergist for more frequent problems this past year with asthma and allergies.    Discussed risks/benefits of medicaiton, including risk in pregnancy.  She will continue Singulair for now.    If not much improved within 2-3 days, call or recheck.  Robyn Cook was seen today for follow-up.  Diagnoses and all orders for this visit:  Moderate persistent asthma with  exacerbation -     Cancel: Ambulatory referral to Allergy -     Ambulatory referral to Allergy  Allergic rhinitis, unspecified seasonality, unspecified trigger -     Cancel: Ambulatory referral to Allergy -     Ambulatory referral to Allergy  [redacted] weeks gestation of pregnancy -     Cancel: Ambulatory referral to Allergy  Other orders -     budesonide-formoterol (SYMBICORT) 160-4.5 MCG/ACT inhaler; Inhale 2 puffs into the lungs 2 (two) times daily. -     albuterol (VENTOLIN HFA) 108 (90 Base) MCG/ACT inhaler; Inhale 2 puffs into the lungs every 6 (six) hours as needed for wheezing. -     amoxicillin (AMOXIL) 875 MG tablet; Take 1 tablet (875 mg total) by mouth 2 (two) times daily for 10 days.  F/u 2 wk

## 2021-03-10 NOTE — Progress Notes (Signed)
Info has been sent to [facility] to get patient set up.

## 2021-04-04 ENCOUNTER — Other Ambulatory Visit: Payer: Self-pay | Admitting: Internal Medicine

## 2021-04-04 MED ORDER — ALBUTEROL SULFATE (2.5 MG/3ML) 0.083% IN NEBU: 2.5000 mg | INHALATION_SOLUTION | Freq: Four times a day (QID) | RESPIRATORY_TRACT | 1 refills | Status: DC | PRN

## 2021-05-09 ENCOUNTER — Encounter: Payer: Self-pay | Admitting: Internal Medicine

## 2021-05-20 ENCOUNTER — Other Ambulatory Visit: Payer: Self-pay

## 2021-05-20 ENCOUNTER — Ambulatory Visit (INDEPENDENT_AMBULATORY_CARE_PROVIDER_SITE_OTHER): Payer: No Typology Code available for payment source | Admitting: Medical

## 2021-05-20 VITALS — BP 102/64 | HR 90 | Temp 97.7°F | Wt 262.4 lb

## 2021-05-20 DIAGNOSIS — L729 Follicular cyst of the skin and subcutaneous tissue, unspecified: Secondary | ICD-10-CM | POA: Diagnosis not present

## 2021-05-20 DIAGNOSIS — Z3A35 35 weeks gestation of pregnancy: Secondary | ICD-10-CM | POA: Insufficient documentation

## 2021-05-20 NOTE — Progress Notes (Signed)
Subjective:  Robyn Cook is a 20 y.o. female who presents for Chief Complaint  Patient presents with   lump on bottom of belly     Here for lump on abdomen.  She is [redacted] weeks pregnant.  She has felt a lump there for several weeks maybe more than a month.  Her gynecologist examined the area and could not feel anything a few weeks ago.  She says sometimes depending on the position she is in you can feel it better, sometimes it seems to be more prominent.  She denies fever, redness, drainage, rash or other.  no other aggravating or relieving factors.    No other c/o.  The following portions of the patient's history were reviewed and updated as appropriate: allergies, current medications, past family history, past medical history, past social history, past surgical history and problem list.  ROS Otherwise as in subjective above    Objective: BP 102/64   Pulse 90   Temp 97.7 F (36.5 C)   Wt 262 lb 6.4 oz (119 kg)   LMP 08/14/2020   SpO2 98%   BMI 56.78 kg/m   General appearance: alert, no distress, well developed, well nourished Gravid uterus Along the right lower abdomen just right of midline there is seems to be a small possibly 2 cm cystic rounded lesion within the subcutaneous tissue that is somewhat tender with pressure upon the lesion but no other obvious lump in the area no other tenderness no redness no induration no fluctuance no warmth. Looking at the abdomen there appears to be a little asymmetry of the shape of the abdomen on the right lower abdomen   Assessment: Encounter Diagnosis  Name Primary?   Cyst of skin Yes     Plan: We discussed the findings on exam and symptoms.  I suspect this is a small cyst within the subcutaneous tissue, likely benign.  I believe she is noticing this because of being pregnant with a gravid uterus pushing down on the tissue particularly if leaning over or putting pressure on that right side of her abdomen.     There is no sign  of abscess or infection today  Given the size of this lesion and location on exam it does not appear to be palpated as within the uterus so I doubt this is a small fibroid  She goes for updated ultrasound next week with gynecology.  I advised she asked him to focus over that area just to see if there happens to be any signs of fibroids within the uterus but I really believe this is a benign cyst  Advised if any changes, redness, worse pain, fever or other get rechecked right away  Robyn Cook was seen today for lump on bottom of belly.  Diagnoses and all orders for this visit:  Cyst of skin    Follow up: prn

## 2021-06-13 ENCOUNTER — Encounter: Payer: Self-pay | Admitting: Internal Medicine

## 2021-10-10 ENCOUNTER — Other Ambulatory Visit: Payer: Self-pay

## 2021-10-10 ENCOUNTER — Encounter: Payer: Self-pay | Admitting: Medical

## 2021-10-10 ENCOUNTER — Telehealth (INDEPENDENT_AMBULATORY_CARE_PROVIDER_SITE_OTHER): Payer: No Typology Code available for payment source | Admitting: Medical

## 2021-10-10 VITALS — Wt 180.0 lb

## 2021-10-10 DIAGNOSIS — U071 COVID-19: Secondary | ICD-10-CM

## 2021-10-10 DIAGNOSIS — J45909 Unspecified asthma, uncomplicated: Secondary | ICD-10-CM | POA: Diagnosis not present

## 2021-10-10 MED ORDER — ALBUTEROL SULFATE HFA 108 (90 BASE) MCG/ACT IN AERS: 2.0000 | INHALATION_SPRAY | Freq: Four times a day (QID) | RESPIRATORY_TRACT | 2 refills | Status: AC | PRN

## 2021-10-10 MED ORDER — EMERGEN-C IMMUNE PLUS PO PACK: 1.0000 | PACK | Freq: Two times a day (BID) | ORAL | 0 refills | Status: AC

## 2021-10-10 NOTE — Progress Notes (Signed)
Subjective:     Patient ID: Robyn Cook, female   DOB: 01-02-2001, 21 y.o.   MRN: [id number]  This visit type was conducted due to national recommendations for restrictions regarding the COVID-19 Pandemic (e.g. social distancing) in an effort to limit this patient's exposure and mitigate transmission in our community.  Due to their co-morbid illnesses, this patient is at least at moderate risk for complications without adequate follow up.  This format is felt to be most appropriate for this patient at this time.    Documentation for virtual audio and video telecommunications through [place] encounter:  The patient was located at home. The provider was located in the office. The patient did consent to this visit and is aware of possible charges through their insurance for this visit.  The other persons participating in this telemedicine service were none. Time spent on call was 20 minutes and in review of previous records 20 minutes total.  This virtual service is not related to other E/M service within previous 7 days.   HPI Chief Complaint  Patient presents with   Covid Positive    Covid positive  today. Symptoms started yesterday- chest tightness, doesn't know where inhaler or breathing machine is    Virtual consult for COVID illness.  Tested positive today.  Symptoms began yesterday with itchy throat.  Today she has itchy throat, stuffy nose, sinus pressure, sneezing, chills and chest tightness.  No cough, no vomiting or diarrhea.  She has had some nausea.  No wheezing or shortness of breath.  No rash.  No other symptoms.  She is taking care of her baby who is 18 months old.  She does not have other people and wants the baby.  She is using a mask and trying to keep from spreading it to baby.   Using Tylenol.  She is not breast-feeding.  She is out of her inhaler, would like a refill.  Past Medical History:  Diagnosis Date   Allergy to cats    Asthma    Eczema     Seasonal allergic rhinitis    Current Outpatient Medications on File Prior to Visit  Medication Sig Dispense Refill   budesonide-formoterol (SYMBICORT) 160-4.5 MCG/ACT inhaler Inhale 2 puffs into the lungs 2 (two) times daily. 1 each 11   montelukast (SINGULAIR) 10 MG tablet Take 1 tablet (10 mg total) by mouth at bedtime. 90 tablet 3   albuterol (PROVENTIL) (2.5 MG/3ML) 0.083% nebulizer solution Take 3 mLs (2.5 mg total) by nebulization every 6 (six) hours as needed for wheezing or shortness of breath. (Patient not taking: Reported on 10/10/2021) 150 mL 1   albuterol (VENTOLIN HFA) 108 (90 Base) MCG/ACT inhaler Inhale 2 puffs into the lungs every 6 (six) hours as needed for wheezing. (Patient not taking: Reported on 10/10/2021) 18 g 1   No current facility-administered medications on file prior to visit.    Review of Systems As in subjective    Objective:   Physical Exam Due to coronavirus pandemic stay at home measures, patient visit was virtual and they were not examined in person.   Wt 180 lb (81.6 kg)    Breastfeeding Unknown    BMI 38.95 kg/m   Gen: wd, wn, nad Somewhat ill-appearing, no labored breathing or witnessed wheezing     Assessment:     Encounter Diagnoses  Name Primary?   COVID-19 virus infection Yes   Moderate asthma without complication, unspecified whether persistent        Plan:  We discussed her symptoms and concerns.  Advise supportive measures, rest, hydration.  Refill inhaler and vitamin pack below for current symptoms.  Go ahead and use inhaler 2-3 times a day.  Can use Robitussin-DM or DayQuil/NyQuil over-the-counter.  If much worse symptoms or new symptoms in the coming days then call back or recheck.  Particularly if having trouble breathing , then get checked out right away  Discussed period of quarantine.  Discussed precautions around baby and other people in the household.  Continue good mask wearing and handwashing.   Self Quarantine: The  CDC, Centers for Disease Control has recommended a self quarantine of 5 days from the start of your illness until you are symptom-free including at least 24 hours of no symptoms including no fever, no shortness of breath, and no body aches and chills, by day 5 before returning to work or general contact with the public.  What does self quarantine mean: avoiding contact with people as much as possible.   Particularly in your house, isolate your self from others in a separate room, wear a mask when possible in the room, particularly if coughing a lot.   Have others bring food, water, medications, etc., to your door, but avoid direct contact with your household contacts during this time to avoid spreading the infection to them.   If you have a separate bathroom and living quarters during the next 2 weeks away from others, that would be preferable.    If you can't completely isolate, then wear a mask, wash hands frequently with soap and water for at least 15 seconds, minimize close contact with others, and have a friend or family member check regularly from a distance to make sure you are not getting seriously worse.     You should not be going out in public, should not be going to stores, to work or other public places until all your symptoms have resolved and at least 5 days + 24 hours of no symptoms at all have transpired.   Ideally you should avoid contact with others for a full 5 days if possible.  One of the goals is to limit spread to high risk people; people that are older and elderly, people with multiple health issues like diabetes, heart disease, lung disease, and anybody that has weakened immune systems such as people with cancer or on immunosuppressive therapy.     Shawnna was seen today for covid positive.  Diagnoses and all orders for this visit:  COVID-19 virus infection  Moderate asthma without complication, unspecified whether persistent  Other orders -     albuterol (VENTOLIN HFA) 108  (90 Base) MCG/ACT inhaler; Inhale 2 puffs into the lungs every 6 (six) hours as needed for wheezing or shortness of breath. -     Multiple Vitamins-Minerals (EMERGEN-C IMMUNE PLUS) PACK; Take 1 tablet by mouth 2 (two) times daily.  F/u prn

## 2021-12-05 ENCOUNTER — Telehealth: Payer: Self-pay | Admitting: Medical

## 2021-12-05 NOTE — Telephone Encounter (Signed)
Dismissal letter in guarantor snapshot  °

## 2022-07-28 ENCOUNTER — Telehealth: Payer: Self-pay

## 2022-07-28 NOTE — Telephone Encounter (Signed)
Pt. Called wanting to schedule an apt. I told her she was dismissed from the practice back in March. She said she could pay some on the bill before she came in for her ap if you would take her back. I told her I would have to run that by you first and usually you have to pay your whole bill off before they will consider taking you back.

## 2022-07-30 ENCOUNTER — Ambulatory Visit
Admission: EM | Admit: 2022-07-30 | Discharge: 2022-07-30 | Disposition: A | Discharge status: Home / Self Care | Payer: No Typology Code available for payment source | Attending: Internal Medicine | Admitting: Internal Medicine

## 2022-07-30 DIAGNOSIS — B370 Candidal stomatitis: Secondary | ICD-10-CM | POA: Diagnosis not present

## 2022-07-30 MED ORDER — NYSTATIN 100000 UNIT/ML MT SUSP: 500000.0000 [IU] | Freq: Four times a day (QID) | OROMUCOSAL | 0 refills | Status: DC

## 2022-07-30 NOTE — Discharge Instructions (Signed)
I am treating you for thrush with nystatin mouthwash.  Please follow-up if symptoms persist or worsen.

## 2022-07-30 NOTE — ED Triage Notes (Signed)
Pt c/o oral thrush that "comes and goes" says it was dx as "pregnancy thrush" then someone told her it may be related to inhaler use.

## 2022-07-30 NOTE — ED Provider Notes (Signed)
[facility]    CSN: [id number] Arrival date & time: 07/30/22  0945      History   Chief Complaint Chief Complaint  Patient presents with   Thrush         HPI Robyn Cook is a 21 y.o. female.   Patient presents with concerns for thrush that has been intermittent over the past 2 months.  Patient reports that she has sores in her mouth and on the end of her tongue as well as a white coating to the tongue intermittently.  Patient states that she was diagnosed with pregnancy thrush when she was pregnant approximately 1 year ago.  She was given nystatin mouthwash with resolution.  States that these symptoms feel similar to when she had pregnancy thrush.  She reports that she has also been told that it could be related to her inhalers.  She states that she uses albuterol inhaler and has another inhaler that she is not sure the name of.  She does not rinse her mouth out after the inhalers.     Past Medical History:  Diagnosis Date   Allergy to cats    Asthma    Eczema    Seasonal allergic rhinitis     Patient Active Problem List   Diagnosis Date Noted   [redacted] weeks gestation of pregnancy 05/20/2021   Cyst of skin 05/20/2021   [redacted] weeks gestation of pregnancy 03/10/2021   [redacted] weeks gestation of pregnancy 01/04/2021   SOB (shortness of breath) 10/24/2019   Tachycardia 10/24/2019   Chest tightness 10/17/2019   Moderate asthma without complication 61/44/3154   Allergic rhinitis due to pollen 10/17/2019   Stress and adjustment reaction 10/17/2019   Allergic rhinitis 07/01/2013   Moderate persistent asthma with exacerbation 07/01/2013    Past Surgical History:  Procedure Laterality Date   TONSILLECTOMY      OB History     Gravida  1   Para      Term      Preterm      AB      Living         SAB      IAB      Ectopic      Multiple      Live Births               Home Medications    Prior to Admission medications   Medication Sig  Start Date End Date Taking? Authorizing Provider  nystatin (MYCOSTATIN) 100000 UNIT/ML suspension Take 5 mLs (500,000 Units total) by mouth 4 (four) times daily. 07/30/22  Yes Terrin Imparato, Hildred Alamin E, FNP  albuterol (PROVENTIL) (2.5 MG/3ML) 0.083% nebulizer solution Take 3 mLs (2.5 mg total) by nebulization every 6 (six) hours as needed for wheezing or shortness of breath. Patient not taking: Reported on 10/10/2021 04/04/21   Tysinger, Camelia Eng, PA-C  albuterol (VENTOLIN HFA) 108 (90 Base) MCG/ACT inhaler Inhale 2 puffs into the lungs every 6 (six) hours as needed for wheezing. Patient not taking: Reported on 10/10/2021 03/10/21   Tysinger, Camelia Eng, PA-C  albuterol (VENTOLIN HFA) 108 (90 Base) MCG/ACT inhaler Inhale 2 puffs into the lungs every 6 (six) hours as needed for wheezing or shortness of breath. 10/10/21   Tysinger, Camelia Eng, PA-C  budesonide-formoterol (SYMBICORT) 160-4.5 MCG/ACT inhaler Inhale 2 puffs into the lungs 2 (two) times daily. 03/10/21   Tysinger, Camelia Eng, PA-C  montelukast (SINGULAIR) 10 MG tablet Take 1 tablet (10 mg total) by  mouth at bedtime. 01/04/21   Tysinger, Kermit Balo, PA-C  Multiple Vitamins-Minerals (EMERGEN-C IMMUNE PLUS) PACK Take 1 tablet by mouth 2 (two) times daily. 10/10/21   Tysinger, Kermit Balo, PA-C    Family History Family History  Problem Relation Age of Onset   Asthma Mother    Hypertension Father     Social History Social History   Tobacco Use   Smoking status: Never    Passive exposure: Yes   Smokeless tobacco: Never  Vaping Use   Vaping Use: Never used  Substance Use Topics   Alcohol use: No   Drug use: Never     Allergies   Latex   Review of Systems Review of Systems Per HPI  Physical Exam Triage Vital Signs ED Triage Vitals [07/30/22 1100]  Enc Vitals Group     BP 107/67     Pulse Rate 86     Resp 16     Temp 98 F (36.7 C)     Temp Source Oral     SpO2 98 %     Weight      Height      Head Circumference      Peak Flow      Pain Score 0      Pain Loc      Pain Edu?      Excl. in GC?    No data found.  Updated Vital Signs BP 107/67 (BP Location: Left Arm)   Pulse 86   Temp 98 F (36.7 C) (Oral)   Resp 16   SpO2 98%   Breastfeeding No   Visual Acuity Right Eye Distance:   Left Eye Distance:   Bilateral Distance:    Right Eye Near:   Left Eye Near:    Bilateral Near:     Physical Exam Constitutional:      General: She is not in acute distress.    Appearance: Normal appearance. She is not toxic-appearing or diaphoretic.  HENT:     Head: Normocephalic and atraumatic.     Mouth/Throat:     Lips: Pink.     Mouth: Mucous membranes are moist. No injury or oral lesions.     Dentition: Normal dentition. No dental tenderness, gingival swelling, dental caries, dental abscesses or gum lesions.     Tongue: No lesions.     Pharynx: Oropharynx is clear.     Comments: There is no obvious abnormality noted to tongue, mouth, posterior pharynx. Eyes:     Extraocular Movements: Extraocular movements intact.     Conjunctiva/sclera: Conjunctivae normal.  Pulmonary:     Effort: Pulmonary effort is normal.  Neurological:     General: No focal deficit present.     Mental Status: She is alert and oriented to person, place, and time. Mental status is at baseline.  Psychiatric:        Mood and Affect: Mood normal.        Behavior: Behavior normal.        Thought Content: Thought content normal.        Judgment: Judgment normal.      UC Treatments / Results  Labs (all labs ordered are listed, but only abnormal results are displayed) Labs Reviewed - No data to display  EKG   Radiology No results found.  Procedures Procedures (including critical care time)  Medications Ordered in UC Medications - No data to display  Initial Impression / Assessment and Plan / UC Course  I have reviewed the  triage vital signs and the nursing notes.  Pertinent labs & imaging results that were available during my care of the  patient were reviewed by me and considered in my medical decision making (see chart for details).     No abnormalities on physical exam.  Although, patient reports that she has had these similar symptoms before and was diagnosed with thrush, treated with nystatin, and had resolution of symptoms.  Therefore, will treat with nystatin and patient was encouraged to follow-up if symptoms persist or worsen.  Patient verbalized understanding and was agreeable with plan. Final Clinical Impressions(s) / UC Diagnoses   Final diagnoses:  Thrush     Discharge Instructions      I am treating you for thrush with nystatin mouthwash.  Please follow-up if symptoms persist or worsen.    ED Prescriptions     Medication Sig Dispense Auth. Provider   nystatin (MYCOSTATIN) 100000 UNIT/ML suspension Take 5 mLs (500,000 Units total) by mouth 4 (four) times daily. 60 mL Gustavus Bryant, [place]      PDMP not reviewed this encounter.   Gustavus Bryant, [place] 07/30/22 1137

## 2022-08-02 ENCOUNTER — Ambulatory Visit
Admission: EM | Admit: 2022-08-02 | Discharge: 2022-08-02 | Disposition: A | Discharge status: Home / Self Care | Payer: No Typology Code available for payment source | Attending: Physician Assistant | Admitting: Physician Assistant

## 2022-08-02 ENCOUNTER — Encounter: Payer: Self-pay | Admitting: Emergency Medicine

## 2022-08-02 DIAGNOSIS — R0602 Shortness of breath: Secondary | ICD-10-CM | POA: Diagnosis not present

## 2022-08-02 DIAGNOSIS — J4541 Moderate persistent asthma with (acute) exacerbation: Secondary | ICD-10-CM

## 2022-08-02 MED ORDER — DM-GUAIFENESIN ER 30-600 MG PO TB12: 1.0000 | ORAL_TABLET | Freq: Two times a day (BID) | ORAL | 1 refills | Status: DC

## 2022-08-02 MED ORDER — BUDESONIDE-FORMOTEROL FUMARATE 160-4.5 MCG/ACT IN AERO: 2.0000 | INHALATION_SPRAY | Freq: Two times a day (BID) | RESPIRATORY_TRACT | 11 refills | Status: AC

## 2022-08-02 MED ORDER — ALBUTEROL SULFATE (2.5 MG/3ML) 0.083% IN NEBU
2.5000 mg | INHALATION_SOLUTION | Freq: Every day | RESPIRATORY_TRACT | Status: DC
  Administered 2022-08-02: 2.5 mg via RESPIRATORY_TRACT

## 2022-08-02 MED ORDER — ALBUTEROL SULFATE (2.5 MG/3ML) 0.083% IN NEBU: 2.5000 mg | INHALATION_SOLUTION | Freq: Four times a day (QID) | RESPIRATORY_TRACT | 1 refills | Status: AC | PRN

## 2022-08-02 MED ORDER — METHYLPREDNISOLONE SODIUM SUCC 125 MG IJ SOLR
80.0000 mg | Freq: Once | INTRAMUSCULAR | Status: AC
  Administered 2022-08-02: 80 mg via INTRAMUSCULAR

## 2022-08-02 MED ORDER — PREDNISONE 10 MG PO TABS: 10.0000 mg | ORAL_TABLET | Freq: Three times a day (TID) | ORAL | 0 refills | Status: DC

## 2022-08-02 MED ORDER — ALBUTEROL SULFATE HFA 108 (90 BASE) MCG/ACT IN AERS: 2.0000 | INHALATION_SPRAY | Freq: Four times a day (QID) | RESPIRATORY_TRACT | 1 refills | Status: AC | PRN

## 2022-08-02 MED ORDER — MONTELUKAST SODIUM 10 MG PO TABS: 10.0000 mg | ORAL_TABLET | Freq: Every day | ORAL | 3 refills | Status: AC

## 2022-08-02 NOTE — Discharge Instructions (Signed)
Advised to take Mucinex DM every 12 hours to help control cough and congestion. Advised to take prednisone 10 mg 3 times a day for 5 days only to help control the respiratory inflammatory process. To use the Symbicort, 2 puffs every 12 hours on a regular basis as a preventative and treatment measures. Advised to use the albuterol inhaler for rescue breathing. Advised to follow-up with PCP or return to urgent care if symptoms fail to improve.

## 2022-08-02 NOTE — ED Provider Notes (Signed)
[facility]    CSN: [id number] Arrival date & time: 08/02/22  0907      History   Chief Complaint Chief Complaint  Patient presents with   Asthma   Cough    HPI Robyn Cook is a 21 y.o. female.   21 year old female presents with congestion, wheezing and shortness of breath.  Patient indicates that she has been having sinus and nasal congestion with clear production.  She also indicates that she has been having bilateral ear congestion.  For the past week she has been having chest congestion, cough, shortness of breath, with wheezing episodes.  Patient indicates that she has been using her albuterol inhaler every couple hours to help reduce wheezing.  Patient indicates that she used her albuterol nebulizer this morning which helped a little bit to reduce her shortness of breath.  Patient denies any fever or chills.  Patient indicates that she ran out Symbicort inhaler several weeks ago so she has not been able to use a preventive treatment for her asthma.  Patient is concerned about the degree of wheezing and shortness of breath she has been having the past 2 days.  Patient denies any nausea or vomiting   Asthma Associated symptoms include shortness of breath.  Cough Associated symptoms: rhinorrhea, shortness of breath and wheezing     Past Medical History:  Diagnosis Date   Allergy to cats    Asthma    Eczema    Seasonal allergic rhinitis     Patient Active Problem List   Diagnosis Date Noted   [redacted] weeks gestation of pregnancy 05/20/2021   Cyst of skin 05/20/2021   [redacted] weeks gestation of pregnancy 03/10/2021   [redacted] weeks gestation of pregnancy 01/04/2021   SOB (shortness of breath) 10/24/2019   Tachycardia 10/24/2019   Chest tightness 10/17/2019   Moderate asthma without complication 10/17/2019   Allergic rhinitis due to pollen 10/17/2019   Stress and adjustment reaction 10/17/2019   Allergic rhinitis 07/01/2013   Moderate persistent asthma with  exacerbation 07/01/2013    Past Surgical History:  Procedure Laterality Date   TONSILLECTOMY      OB History     Gravida  1   Para      Term      Preterm      AB      Living         SAB      IAB      Ectopic      Multiple      Live Births               Home Medications    Prior to Admission medications   Medication Sig Start Date End Date Taking? Authorizing Provider  dextromethorphan-guaiFENesin (MUCINEX DM) 30-600 MG 12hr tablet Take 1 tablet by mouth 2 (two) times daily. 08/02/22  Yes Ellsworth Lennox, PA-C  predniSONE (DELTASONE) 10 MG tablet Take 1 tablet (10 mg total) by mouth 3 (three) times daily. 08/02/22  Yes Ellsworth Lennox, PA-C  albuterol (PROVENTIL) (2.5 MG/3ML) 0.083% nebulizer solution Take 3 mLs (2.5 mg total) by nebulization every 6 (six) hours as needed for wheezing or shortness of breath. 08/02/22   Ellsworth Lennox, PA-C  albuterol (VENTOLIN HFA) 108 (90 Base) MCG/ACT inhaler Inhale 2 puffs into the lungs every 6 (six) hours as needed for wheezing or shortness of breath. 10/10/21   Tysinger, Kermit Balo, PA-C  albuterol (VENTOLIN HFA) 108 (90 Base) MCG/ACT inhaler Inhale 2 puffs into  the lungs every 6 (six) hours as needed for wheezing. 08/02/22   Ellsworth Lennox, PA-C  budesonide-formoterol [facility]) 160-4.5 MCG/ACT inhaler Inhale 2 puffs into the lungs 2 (two) times daily. 08/02/22   Ellsworth Lennox, PA-C  montelukast (SINGULAIR) 10 MG tablet Take 1 tablet (10 mg total) by mouth at bedtime. 08/02/22   Ellsworth Lennox, PA-C  Multiple Vitamins-Minerals (EMERGEN-C IMMUNE PLUS) PACK Take 1 tablet by mouth 2 (two) times daily. 10/10/21   Tysinger, Kermit Balo, PA-C  nystatin (MYCOSTATIN) 100000 UNIT/ML suspension Take 5 mLs (500,000 Units total) by mouth 4 (four) times daily. 07/30/22   Gustavus Bryant, FNP    Family History Family History  Problem Relation Age of Onset   Asthma Mother    Hypertension Father     Social History Social History   Tobacco Use   Smoking  status: Never    Passive exposure: Yes   Smokeless tobacco: Never  Vaping Use   Vaping Use: Never used  Substance Use Topics   Alcohol use: No   Drug use: Never     Allergies   Latex   Review of Systems Review of Systems  HENT:  Positive for postnasal drip, rhinorrhea and sinus pressure.   Respiratory:  Positive for cough, shortness of breath and wheezing.      Physical Exam Triage Vital Signs ED Triage Vitals [08/02/22 1027]  Enc Vitals Group     BP 95/64     Pulse Rate 83     Resp 20     Temp 97.7 F (36.5 C)     Temp src      SpO2 98 %     Weight      Height      Head Circumference      Peak Flow      Pain Score 5     Pain Loc      Pain Edu?      Excl. in GC?    No data found.  Updated Vital Signs BP 95/64   Pulse 83   Temp 97.7 F (36.5 C)   Resp 20   SpO2 98%   Visual Acuity Right Eye Distance:   Left Eye Distance:   Bilateral Distance:    Right Eye Near:   Left Eye Near:    Bilateral Near:     Physical Exam Constitutional:      Appearance: Normal appearance.  HENT:     Right Ear: Ear canal normal. Tympanic membrane is injected.     Left Ear: Ear canal normal. Tympanic membrane is injected.     Mouth/Throat:     Mouth: Mucous membranes are moist.     Pharynx: Oropharynx is clear. Uvula midline.  Cardiovascular:     Rate and Rhythm: Normal rate and regular rhythm.     Heart sounds: Normal heart sounds.  Pulmonary:     Effort: Pulmonary effort is normal.     Breath sounds: Normal air entry. Examination of the right-lower field reveals wheezing and rhonchi. Examination of the left-lower field reveals wheezing and rhonchi. Wheezing (moderate) and rhonchi (moderate) present. No rales.  Lymphadenopathy:     Cervical: No cervical adenopathy.  Neurological:     Mental Status: She is alert.      UC Treatments / Results  Labs (all labs ordered are listed, but only abnormal results are displayed) Labs Reviewed - No data to  display  EKG   Radiology No results found.  Procedures Procedures (including critical care  time)  Medications Ordered in UC Medications  albuterol (PROVENTIL) (2.5 MG/3ML) 0.083% nebulizer solution 2.5 mg (2.5 mg Nebulization Given 08/02/22 1052)  methylPREDNISolone sodium succinate (SOLU-MEDROL) 125 mg/2 mL injection 80 mg (80 mg Intramuscular Given 08/02/22 1051)    Initial Impression / Assessment and Plan / UC Course  I have reviewed the triage vital signs and the nursing notes.  Pertinent labs & imaging results that were available during my care of the patient were reviewed by me and considered in my medical decision making (see chart for details).    Plan: 1.  The shortness of breath will be treated with the following: A.  Albuterol nebulizer treatment in the office is given. 2.Persistent asthma without acute exacerbation and treated with the following: A.  Albuterol nebulizer treatment in the office is given. Solu-Medrol 80 mg given in the office today. C.  Prednisone 10 mg every 8 hours for 5 days only to treat the inflammatory component. D.  Symbicort 160/4.5, 2 puffs every 12 hours to help prevent asthma exacerbations. E.  Singulair 10 mg, 1 tablet daily to help prevent asthma exacerbations and treat the asthma. F.  Mucinex DM every 12 hours to help treat cough and thin the secretions. G.  Albuterol inhaler, 2 puffs every 6 hours on regular basis to treat acute asthma exacerbations. 3.  Follow-up with PCP or return to urgent care if symptoms fail to improve.  Final Clinical Impressions(s) / UC Diagnoses   Final diagnoses:  SOB (shortness of breath)  Moderate persistent asthma with acute exacerbation     Discharge Instructions      Advised to take Mucinex DM every 12 hours to help control cough and congestion. Advised to take prednisone 10 mg 3 times a day for 5 days only to help control the respiratory inflammatory process. To use the Symbicort, 2 puffs every  12 hours on a regular basis as a preventative and treatment measures. Advised to use the albuterol inhaler for rescue breathing. Advised to follow-up with PCP or return to urgent care if symptoms fail to improve.    ED Prescriptions     Medication Sig Dispense Auth. Provider   albuterol (PROVENTIL) (2.5 MG/3ML) 0.083% nebulizer solution Take 3 mLs (2.5 mg total) by nebulization every 6 (six) hours as needed for wheezing or shortness of breath. 150 mL Ellsworth Lennox, PA-C   albuterol (VENTOLIN HFA) 108 (90 Base) MCG/ACT inhaler Inhale 2 puffs into the lungs every 6 (six) hours as needed for wheezing. 18 g Ellsworth Lennox, PA-C   budesonide-formoterol [facility]) 160-4.5 MCG/ACT inhaler Inhale 2 puffs into the lungs 2 (two) times daily. 1 each Ellsworth Lennox, PA-C   montelukast (SINGULAIR) 10 MG tablet Take 1 tablet (10 mg total) by mouth at bedtime. 90 tablet Ellsworth Lennox, PA-C   predniSONE (DELTASONE) 10 MG tablet Take 1 tablet (10 mg total) by mouth 3 (three) times daily. 15 tablet Ellsworth Lennox, PA-C   dextromethorphan-guaiFENesin [facility] DM) 30-600 MG 12hr tablet Take 1 tablet by mouth 2 (two) times daily. 20 tablet Ellsworth Lennox, PA-C      PDMP not reviewed this encounter.   Ellsworth Lennox, PA-C 08/02/22 1112

## 2022-08-02 NOTE — ED Triage Notes (Signed)
Pt is present today with SOB, chest tightness, and slight cough. Pt asthma flare started one week ago. Pt states that she used her nebulizer this morning with temporary relief

## 2022-08-03 ENCOUNTER — Ambulatory Visit: Payer: No Typology Code available for payment source | Admitting: Medical

## 2023-03-12 ENCOUNTER — Ambulatory Visit
Admission: RE | Admit: 2023-03-12 | Discharge: 2023-03-12 | Disposition: A | Discharge status: Home / Self Care | Payer: No Typology Code available for payment source | Source: Ambulatory Visit | Attending: Medical | Admitting: Medical

## 2023-03-12 VITALS — BP 126/87 | HR 100 | Temp 98.0°F | Resp 16

## 2023-03-12 DIAGNOSIS — Z711 Person with feared health complaint in whom no diagnosis is made: Secondary | ICD-10-CM

## 2023-03-12 NOTE — Discharge Instructions (Addendum)
Blood pressure is normal here.  Continue to monitor at home with random BP checks.  Return as needed.

## 2023-03-12 NOTE — ED Triage Notes (Signed)
Pt reports checking her BP at family member's house yesterday due to having a HA and toothache; states reading was around 150/86. States wanted to check BP again today.

## 2023-03-12 NOTE — ED Provider Notes (Signed)
[facility]    CSN: [id number] Arrival date & time: 03/12/23  1844      History   Chief Complaint Chief Complaint  Patient presents with   Hypertension   Appt    1845    HPI Robyn Cook is a 22 y.o. female.   Patient presents to urgent care for evaluation of elevated BP reading at home. She used home cuff to measure BP yesterday and found her BP to be 150s/80s. States she normally runs in the 100s/70s range. States she was experiencing some pain to her wisdom teeth yesterday when she took BP and wonders if this could have been the reason for elevated BP reading. She does not take her blood pressure regularly at home but mentioned that she had a headache yesterday and wanted to take BP. No longer experiencing headache or dental pain. No recent vision changes, chest pain, shortness of breath, dizziness, or diplopia. She did not have any BP problems in pregnancy and has never been diagnosed with HTN.    Hypertension    Past Medical History:  Diagnosis Date   Allergy to cats    Asthma    Eczema    Seasonal allergic rhinitis     Patient Active Problem List   Diagnosis Date Noted   [redacted] weeks gestation of pregnancy 05/20/2021   Cyst of skin 05/20/2021   [redacted] weeks gestation of pregnancy 03/10/2021   [redacted] weeks gestation of pregnancy 01/04/2021   SOB (shortness of breath) 10/24/2019   Tachycardia 10/24/2019   Chest tightness 10/17/2019   Moderate asthma without complication 10/17/2019   Allergic rhinitis due to pollen 10/17/2019   Stress and adjustment reaction 10/17/2019   Allergic rhinitis 07/01/2013   Moderate persistent asthma with exacerbation 07/01/2013    Past Surgical History:  Procedure Laterality Date   TONSILLECTOMY      OB History     Gravida  1   Para      Term      Preterm      AB      Living         SAB      IAB      Ectopic      Multiple      Live Births               Home Medications    Prior to  Admission medications   Medication Sig Start Date End Date Taking? Authorizing Provider  albuterol (PROVENTIL) (2.5 MG/3ML) 0.083% nebulizer solution Take 3 mLs (2.5 mg total) by nebulization every 6 (six) hours as needed for wheezing or shortness of breath. 08/02/22   Ellsworth Lennox, PA-C  albuterol (VENTOLIN HFA) 108 (90 Base) MCG/ACT inhaler Inhale 2 puffs into the lungs every 6 (six) hours as needed for wheezing or shortness of breath. 10/10/21   Tysinger, Kermit Balo, PA-C  albuterol (VENTOLIN HFA) 108 (90 Base) MCG/ACT inhaler Inhale 2 puffs into the lungs every 6 (six) hours as needed for wheezing. 08/02/22   Ellsworth Lennox, PA-C  budesonide-formoterol [facility]) 160-4.5 MCG/ACT inhaler Inhale 2 puffs into the lungs 2 (two) times daily. 08/02/22   Ellsworth Lennox, PA-C  dextromethorphan-guaiFENesin [facility] DM) 30-600 MG 12hr tablet Take 1 tablet by mouth 2 (two) times daily. 08/02/22   Ellsworth Lennox, PA-C  montelukast (SINGULAIR) 10 MG tablet Take 1 tablet (10 mg total) by mouth at bedtime. 08/02/22   Ellsworth Lennox, PA-C  Multiple Vitamins-Minerals (EMERGEN-C IMMUNE PLUS) PACK Take 1 tablet  by mouth 2 (two) times daily. 10/10/21   Tysinger, Kermit Balo, PA-C  nystatin (MYCOSTATIN) 100000 UNIT/ML suspension Take 5 mLs (500,000 Units total) by mouth 4 (four) times daily. 07/30/22   Gustavus Bryant, FNP  predniSONE (DELTASONE) 10 MG tablet Take 1 tablet (10 mg total) by mouth 3 (three) times daily. 08/02/22   Ellsworth Lennox, PA-C    Family History Family History  Problem Relation Age of Onset   Asthma Mother    Hypertension Father     Social History Social History   Tobacco Use   Smoking status: Never    Passive exposure: Yes   Smokeless tobacco: Never  Vaping Use   Vaping Use: Never used  Substance Use Topics   Alcohol use: No   Drug use: Never     Allergies   Latex   Review of Systems Review of Systems Per HPI  Physical Exam Triage Vital Signs ED Triage Vitals [03/12/23 1912]  Enc Vitals  Group     BP 126/87     Pulse Rate 100     Resp 16     Temp 98 F (36.7 C)     Temp Source Oral     SpO2 98 %     Weight      Height      Head Circumference      Peak Flow      Pain Score      Pain Loc      Pain Edu?      Excl. in GC?    No data found.  Updated Vital Signs BP 126/87   Pulse 100   Temp 98 F (36.7 C) (Oral)   Resp 16   LMP 02/17/2023 (Approximate)   SpO2 98%   Visual Acuity Right Eye Distance:   Left Eye Distance:   Bilateral Distance:    Right Eye Near:   Left Eye Near:    Bilateral Near:     Physical Exam Vitals and nursing note reviewed.  Constitutional:      Appearance: She is obese. She is not ill-appearing or toxic-appearing.  HENT:     Head: Normocephalic and atraumatic.     Right Ear: Hearing and external ear normal.     Left Ear: Hearing and external ear normal.     Nose: Nose normal.     Mouth/Throat:     Lips: Pink.  Eyes:     General: Lids are normal. Vision grossly intact. Gaze aligned appropriately.     Extraocular Movements: Extraocular movements intact.     Conjunctiva/sclera: Conjunctivae normal.  Cardiovascular:     Rate and Rhythm: Normal rate and regular rhythm.     Heart sounds: Normal heart sounds, S1 normal and S2 normal.  Pulmonary:     Effort: Pulmonary effort is normal. No respiratory distress.     Breath sounds: Normal breath sounds and air entry.  Musculoskeletal:     Cervical back: Neck supple.  Skin:    General: Skin is warm and dry.     Capillary Refill: Capillary refill takes less than 2 seconds.     Findings: No rash.  Neurological:     General: No focal deficit present.     Mental Status: She is alert and oriented to person, place, and time. Mental status is at baseline.     Cranial Nerves: No dysarthria or facial asymmetry.  Psychiatric:        Mood and Affect: Mood normal.  Speech: Speech normal.        Behavior: Behavior normal.        Thought Content: Thought content normal.         Judgment: Judgment normal.      UC Treatments / Results  Labs (all labs ordered are listed, but only abnormal results are displayed) Labs Reviewed - No data to display  EKG   Radiology No results found.  Procedures Procedures (including critical care time)  Medications Ordered in UC Medications - No data to display  Initial Impression / Assessment and Plan / UC Course  I have reviewed the triage vital signs and the nursing notes.  Pertinent labs & imaging results that were available during my care of the patient were reviewed by me and considered in my medical decision making (see chart for details).   1.  Physically well but worried BP normal in clinic. Suspect elevated BP reading was likely related to pain. Advised to continue monitoring BP at home intermittently. Recommend exercise to help keep BP in normal range. May follow-up with PCP as needed for ongoing evaluation.  Discussed physical exam and available lab work findings in clinic with patient.  Counseled patient regarding appropriate use of medications and potential side effects for all medications recommended or prescribed today. Discussed red flag signs and symptoms of worsening condition,when to call the PCP office, return to urgent care, and when to seek higher level of care in the emergency department. Patient verbalizes understanding and agreement with plan. All questions answered. Patient discharged in stable condition.    Final Clinical Impressions(s) / UC Diagnoses   Final diagnoses:  Physically well but worried     Discharge Instructions      Blood pressure is normal here.  Continue to monitor at home with random BP checks.  Return as needed.     ED Prescriptions   None    PDMP not reviewed this encounter.   Carlisle Beers, [place] 03/12/23 2204

## 2023-03-20 ENCOUNTER — Ambulatory Visit
Admission: EM | Admit: 2023-03-20 | Discharge: 2023-03-20 | Disposition: A | Discharge status: Home / Self Care | Payer: No Typology Code available for payment source | Attending: Family Medicine | Admitting: Family Medicine

## 2023-03-20 DIAGNOSIS — M25571 Pain in right ankle and joints of right foot: Secondary | ICD-10-CM

## 2023-03-20 DIAGNOSIS — S90822A Blister (nonthermal), left foot, initial encounter: Secondary | ICD-10-CM

## 2023-03-20 MED ORDER — NAPROXEN 500 MG PO TABS: 500.0000 mg | ORAL_TABLET | Freq: Two times a day (BID) | ORAL | 0 refills | Status: AC | PRN

## 2023-03-20 NOTE — Discharge Instructions (Signed)
Take naproxen 500 mg--1 tablet every 12 hours as needed for pain  Try either Band-Aids to fit the blisters or padding that she can get at the store to stick to the inside of your shoe to help with the blisters.

## 2023-03-20 NOTE — ED Triage Notes (Signed)
Pt c/o right ankle injury, pt states shesprain it 2 weeks ago and doesn't think its completely healed. Pt also says she has a blister on her left heel and ankle from her work boots

## 2023-03-20 NOTE — ED Provider Notes (Signed)
[facility]    CSN: [id number] Arrival date & time: 03/20/23  1554      History   Chief Complaint No chief complaint on file.   HPI Robyn Cook is a 22 y.o. female.   HPI here for right ankle pain that has been going on for about 2 months  Returned and inverted her ankle.  She was seen at an outside facility and had an x-ray.  She was told she had a fracture and was provided an Aircast to wear.  It has improved but at times when she is walking it will still pop and then will hurt a lot for a while.  It does swell still.  Also on her left heel she has a blister that is formed since she has been wearing a new.  Boots at work.  She is also developing a new blister on her pinky toe.  No drainage but the blisters do get irritated.    Past Medical History:  Diagnosis Date   Allergy to cats    Asthma    Eczema    Seasonal allergic rhinitis     Patient Active Problem List   Diagnosis Date Noted   [redacted] weeks gestation of pregnancy 05/20/2021   Cyst of skin 05/20/2021   [redacted] weeks gestation of pregnancy 03/10/2021   [redacted] weeks gestation of pregnancy 01/04/2021   SOB (shortness of breath) 10/24/2019   Tachycardia 10/24/2019   Chest tightness 10/17/2019   Moderate asthma without complication 10/17/2019   Allergic rhinitis due to pollen 10/17/2019   Stress and adjustment reaction 10/17/2019   Allergic rhinitis 07/01/2013   Moderate persistent asthma with exacerbation 07/01/2013    Past Surgical History:  Procedure Laterality Date   TONSILLECTOMY      OB History     Gravida  1   Para      Term      Preterm      AB      Living         SAB      IAB      Ectopic      Multiple      Live Births               Home Medications    Prior to Admission medications   Medication Sig Start Date End Date Taking? Authorizing Provider  naproxen (NAPROSYN) 500 MG tablet Take 1 tablet (500 mg total) by mouth 2 (two) times daily as needed (pain).  03/20/23  Yes Zenia Resides, MD  albuterol (PROVENTIL) (2.5 MG/3ML) 0.083% nebulizer solution Take 3 mLs (2.5 mg total) by nebulization every 6 (six) hours as needed for wheezing or shortness of breath. 08/02/22   Ellsworth Lennox, PA-C  albuterol (VENTOLIN HFA) 108 (90 Base) MCG/ACT inhaler Inhale 2 puffs into the lungs every 6 (six) hours as needed for wheezing or shortness of breath. 10/10/21   Tysinger, Kermit Balo, PA-C  albuterol (VENTOLIN HFA) 108 (90 Base) MCG/ACT inhaler Inhale 2 puffs into the lungs every 6 (six) hours as needed for wheezing. 08/02/22   Ellsworth Lennox, PA-C  budesonide-formoterol [facility]) 160-4.5 MCG/ACT inhaler Inhale 2 puffs into the lungs 2 (two) times daily. 08/02/22   Ellsworth Lennox, PA-C  montelukast (SINGULAIR) 10 MG tablet Take 1 tablet (10 mg total) by mouth at bedtime. 08/02/22   Ellsworth Lennox, PA-C  Multiple Vitamins-Minerals (EMERGEN-C IMMUNE PLUS) PACK Take 1 tablet by mouth 2 (two) times daily. 10/10/21   Crosby Oyster  S, PA-C    Family History Family History  Problem Relation Age of Onset   Asthma Mother    Hypertension Father     Social History Social History   Tobacco Use   Smoking status: Never    Passive exposure: Yes   Smokeless tobacco: Never  Vaping Use   Vaping Use: Never used  Substance Use Topics   Alcohol use: No   Drug use: Never     Allergies   Latex   Review of Systems Review of Systems   Physical Exam Triage Vital Signs ED Triage Vitals  Enc Vitals Group     BP 03/20/23 1621 90/67     Pulse Rate 03/20/23 1621 83     Resp 03/20/23 1621 15     Temp 03/20/23 1621 98.3 F (36.8 C)     Temp Source 03/20/23 1621 Oral     SpO2 03/20/23 1621 96 %     Weight --      Height --      Head Circumference --      Peak Flow --      Pain Score 03/20/23 1624 8     Pain Loc --      Pain Edu? --      Excl. in GC? --    No data found.  Updated Vital Signs BP 90/67 (BP Location: Left Arm)   Pulse 83   Temp 98.3 F (36.8 C)  (Oral)   Resp 15   LMP 02/17/2023 (Approximate)   SpO2 96%   Visual Acuity Right Eye Distance:   Left Eye Distance:   Bilateral Distance:    Right Eye Near:   Left Eye Near:    Bilateral Near:     Physical Exam Vitals reviewed.  Constitutional:      General: She is not in acute distress.    Appearance: She is not ill-appearing, toxic-appearing or diaphoretic.  Musculoskeletal:     Comments: There is some swelling overlying the right lateral malleolus.  There is no ecchymosis or deformity.  Pulses normal on the dorsalis pedis area of the right foot.    Skin:    Coloration: Skin is not jaundiced or pale.     Comments: On the left heel there is a blister that has popped that is about 3 cm x 2-1/2 cm.  There is no surrounding induration.  There is no purulent drainage.  Neurological:     Mental Status: She is alert and oriented to person, place, and time.      UC Treatments / Results  Labs (all labs ordered are listed, but only abnormal results are displayed) Labs Reviewed - No data to display  EKG   Radiology No results found.  Procedures Procedures (including critical care time)  Medications Ordered in UC Medications - No data to display  Initial Impression / Assessment and Plan / UC Course  I have reviewed the triage vital signs and the nursing notes.  Pertinent labs & imaging results that were available during my care of the patient were reviewed by me and considered in my medical decision making (see chart for details).        She states that the Aircast she was provided at the other facility does not fit into her shoes.  Ace wrap was provided for today and naproxen 500 mg is sent in for pain relief.  We discussed padding her blisters with either Band-Aids or inserts from the store to pad where the blisters  are forming.   she is given contact information for podiatry and orthopedic specialist. Final Clinical Impressions(s) / UC Diagnoses   Final  diagnoses:  Acute right ankle pain  Blister of left heel, initial encounter     Discharge Instructions      Take naproxen 500 mg--1 tablet every 12 hours as needed for pain  Try either Band-Aids to fit the blisters or padding that she can get at the store to stick to the inside of your shoe to help with the blisters.       ED Prescriptions     Medication Sig Dispense Auth. Provider   naproxen (NAPROSYN) 500 MG tablet Take 1 tablet (500 mg total) by mouth 2 (two) times daily as needed (pain). 30 tablet Tyreek Clabo, Janace Aris, MD      PDMP not reviewed this encounter.   Zenia Resides, MD 03/20/23 [phone]

## 2023-04-09 ENCOUNTER — Ambulatory Visit: Payer: No Typology Code available for payment source | Admitting: Podiatry

## 2023-05-11 ENCOUNTER — Ambulatory Visit: Payer: No Typology Code available for payment source | Admitting: Family Medicine

## 2023-12-24 ENCOUNTER — Emergency Department (HOSPITAL_BASED_OUTPATIENT_CLINIC_OR_DEPARTMENT_OTHER)
Admission: EM | Admit: 2023-12-24 | Discharge: 2023-12-24 | Disposition: A | Discharge status: Home / Self Care | Attending: Emergency Medicine | Admitting: Emergency Medicine

## 2023-12-24 ENCOUNTER — Encounter (HOSPITAL_BASED_OUTPATIENT_CLINIC_OR_DEPARTMENT_OTHER): Payer: Self-pay | Admitting: Emergency Medicine

## 2023-12-24 ENCOUNTER — Other Ambulatory Visit: Payer: Self-pay

## 2023-12-24 DIAGNOSIS — R002 Palpitations: Secondary | ICD-10-CM | POA: Insufficient documentation

## 2023-12-24 LAB — CBC WITH DIFFERENTIAL/PLATELET
Abs Immature Granulocytes: 0.02 10*3/uL (ref 0.00–0.07)
Basophils Absolute: 0 10*3/uL (ref 0.0–0.1)
Basophils Relative: 0 %
Eosinophils Absolute: 0.1 10*3/uL (ref 0.0–0.5)
Eosinophils Relative: 1 %
HCT: 37.2 % (ref 36.0–46.0)
Hemoglobin: 11.8 g/dL — ABNORMAL LOW (ref 12.0–15.0)
Immature Granulocytes: 0 %
Lymphocytes Relative: 36 %
Lymphs Abs: 2.8 10*3/uL (ref 0.7–4.0)
MCH: 28.6 pg (ref 26.0–34.0)
MCHC: 31.7 g/dL (ref 30.0–36.0)
MCV: 90.1 fL (ref 80.0–100.0)
Monocytes Absolute: 0.5 10*3/uL (ref 0.1–1.0)
Monocytes Relative: 6 %
Neutro Abs: 4.5 10*3/uL (ref 1.7–7.7)
Neutrophils Relative %: 57 %
Platelets: 289 10*3/uL (ref 150–400)
RBC: 4.13 MIL/uL (ref 3.87–5.11)
RDW: 13 % (ref 11.5–15.5)
WBC: 7.9 10*3/uL (ref 4.0–10.5)
nRBC: 0 % (ref 0.0–0.2)

## 2023-12-24 LAB — BASIC METABOLIC PANEL WITH GFR
Anion gap: 7 (ref 5–15)
BUN: 11 mg/dL (ref 6–20)
CO2: 28 mmol/L (ref 22–32)
Calcium: 9 mg/dL (ref 8.9–10.3)
Chloride: 101 mmol/L (ref 98–111)
Creatinine, Ser: 0.58 mg/dL (ref 0.44–1.00)
GFR, Estimated: >60 mL/min (ref >60)
Glucose, Bld: 93 mg/dL (ref 70–99)
Potassium: 3.7 mmol/L (ref 3.5–5.1)
Sodium: 136 mmol/L (ref 135–145)

## 2023-12-24 MED ORDER — HYDROXYZINE HCL 25 MG PO TABS: 25.0000 mg | ORAL_TABLET | Freq: Three times a day (TID) | ORAL | 0 refills | Status: AC | PRN

## 2023-12-24 NOTE — ED Provider Notes (Signed)
 Emergency Department Provider Note   I have reviewed the triage vital signs and the nursing notes.   HISTORY  Chief Complaint Anxiety   HPI Robyn Cook is a 23 y.o. female presents to the emergency department for evaluation of intermittent heart palpitations and anxious feelings.  She stopped her ADHD medication approximately on 3/12.  Since around that time she has had increased palpitations and anxious feelings.  She has had some difficulty sleeping.  No chest pain.  She has had headaches as well.  She did stop her ADHD medication abruptly without taper or communication with her MD.   Past Medical History:  Diagnosis Date   Allergy to cats    Asthma    Eczema    Seasonal allergic rhinitis     Review of Systems  Constitutional: No fever/chills Cardiovascular: Denies chest pain. Positive palpitations.  Respiratory: Denies shortness of breath. Gastrointestinal: No abdominal pain. Skin: Negative for rash. Neurological: Positive HA.   ____________________________________________   PHYSICAL EXAM:  VITAL SIGNS: ED Triage Vitals  Encounter Vitals Group     BP 12/24/23 0434 134/83     Pulse Rate 12/24/23 0434 87     Resp 12/24/23 0434 16     Temp 12/24/23 0434 98 F (36.7 C)     Temp Source 12/24/23 0434 Temporal     SpO2 12/24/23 0434 97 %     Weight 12/24/23 0435 237 lb (107.5 kg)     Height 12/24/23 0435 4\' 9"  (1.448 m)   Constitutional: Alert and oriented. Well appearing and in no acute distress. Eyes: Conjunctivae are normal.  Head: Atraumatic. Nose: No congestion/rhinnorhea. Mouth/Throat: Mucous membranes are moist.   Neck: No stridor.   Cardiovascular: Normal rate, regular rhythm. Good peripheral circulation. Grossly normal heart sounds.   Respiratory: Normal respiratory effort.  No retractions. Lungs CTAB. Gastrointestinal: No distention.  Musculoskeletal: No gross deformities of extremities. Neurologic:  Normal speech and language. No gross  focal neurologic deficits are appreciated.  Skin:  Skin is warm, dry and intact. No rash noted.  ____________________________________________  EKG   EKG Interpretation Date/Time:  Monday December 24 2023 06:12:07 EDT Ventricular Rate:  84 PR Interval:  152 QRS Duration:  92 QT Interval:  370 QTC Calculation: 437 R Axis:   35  Text Interpretation: Normal sinus rhythm Normal ECG When compared with ECG of 22-Aug-2019 01:28, No significant change was found Confirmed by Dione Booze ([id number]) on 12/24/2023 6:14:27 AM       ____________________________________________   PROCEDURES  Procedure(s) performed:   Procedures  None  ____________________________________________   INITIAL IMPRESSION / ASSESSMENT AND PLAN / ED COURSE  Pertinent labs & imaging results that were available during my care of the patient were reviewed by me and considered in my medical decision making (see chart for details).   This patient is Presenting for Evaluation of palpitations, which does require a range of treatment options, and is a complaint that involves a high risk of morbidity and mortality.  The Differential Diagnoses include anxiety attack, anemia, electrolyte disturbance, acute kidney injury, arrhythmia, etc.   Clinical Laboratory Tests Ordered, included   Cardiac Monitor Tracing which shows NSR.    Social Determinants of Health Risk patient is a non-smoker.   Medical Decision Making: Summary:  The patient presents to the emergency department with intermittent palpitations which seem to coincide with her stopping her ADHD medication abruptly.  She continues to have symptoms, however, 2 weeks later.  Plan for screening blood work  and reassess.  EKG is reassuring.  May benefit from Atarax as needed symptoms and has a PCP appointment scheduled for Friday to discuss.  Reevaluation with update and discussion with patient. She suddenly needs to leave the ED to pick up he child. Labs drawn but  cannot wait for results. Will follow labs with PCP. Requesting discharge.   Labs reviewed after discharge and reassuring. No indication for patient callback or additional workup.   Patient's presentation is most consistent with acute presentation with potential threat to life or bodily function.   Disposition: discharge  ____________________________________________  FINAL CLINICAL IMPRESSION(S) / ED DIAGNOSES  Final diagnoses:  Palpitations     NEW OUTPATIENT MEDICATIONS STARTED DURING THIS VISIT:  Discharge Medication List as of 12/24/2023  7:26 AM     START taking these medications   Details  hydrOXYzine (ATARAX) 25 MG tablet Take 1 tablet (25 mg total) by mouth every 8 (eight) hours as needed for anxiety., Starting Mon 12/24/2023, Normal        Note:  This document was prepared using Dragon voice recognition software and may include unintentional dictation errors.  Alona Bene, MD, [facility] Emergency Medicine    Imanii Gosdin, Arlyss Repress, MD 12/27/23 0800

## 2023-12-24 NOTE — Discharge Instructions (Signed)
 You are leaving before any of your labs have resulted. Please follow these labs in your chart and see your PCP on Friday.

## 2023-12-24 NOTE — ED Notes (Signed)
 Upon meeting pt, she requested d/c due to needing to pick up her child so that her husband could get to work on time. She requested to proceed with obtaining lab work and stated she would check her mychart for results. EDP was made aware. Risks of leaving before results were explained to pt who verbalized understanding and maintained her wish to leave.

## 2023-12-24 NOTE — ED Triage Notes (Signed)
 Pt reports hx of anxiety, feels same with palpations, SOB, headache, pt reports increased to almost daily episodes, reports abruptly stopping taking ADHD medication

## 2024-05-27 ENCOUNTER — Other Ambulatory Visit: Payer: Self-pay

## 2024-05-27 ENCOUNTER — Encounter (HOSPITAL_BASED_OUTPATIENT_CLINIC_OR_DEPARTMENT_OTHER): Payer: Self-pay

## 2024-05-27 ENCOUNTER — Emergency Department (HOSPITAL_BASED_OUTPATIENT_CLINIC_OR_DEPARTMENT_OTHER)
Admission: EM | Admit: 2024-05-27 | Discharge: 2024-05-27 | Discharge status: Against Medical Advice | Attending: Emergency Medicine | Admitting: Emergency Medicine

## 2024-05-27 DIAGNOSIS — Z5321 Procedure and treatment not carried out due to patient leaving prior to being seen by health care provider: Secondary | ICD-10-CM | POA: Insufficient documentation

## 2024-05-27 DIAGNOSIS — S0990XA Unspecified injury of head, initial encounter: Secondary | ICD-10-CM | POA: Insufficient documentation
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
## 2024-08-12 ENCOUNTER — Other Ambulatory Visit: Payer: Self-pay

## 2024-08-12 ENCOUNTER — Emergency Department (HOSPITAL_BASED_OUTPATIENT_CLINIC_OR_DEPARTMENT_OTHER)
Admission: EM | Admit: 2024-08-12 | Discharge: 2024-08-12 | Disposition: A | Discharge status: Home / Self Care | Attending: Emergency Medicine | Admitting: Emergency Medicine

## 2024-08-12 ENCOUNTER — Other Ambulatory Visit (HOSPITAL_BASED_OUTPATIENT_CLINIC_OR_DEPARTMENT_OTHER): Payer: Self-pay

## 2024-08-12 ENCOUNTER — Emergency Department (HOSPITAL_BASED_OUTPATIENT_CLINIC_OR_DEPARTMENT_OTHER): Admitting: Radiology

## 2024-08-12 DIAGNOSIS — J45909 Unspecified asthma, uncomplicated: Secondary | ICD-10-CM | POA: Diagnosis not present

## 2024-08-12 DIAGNOSIS — Z91040 Latex allergy status: Secondary | ICD-10-CM | POA: Diagnosis not present

## 2024-08-12 DIAGNOSIS — Z91010 Allergy to peanuts: Secondary | ICD-10-CM | POA: Insufficient documentation

## 2024-08-12 DIAGNOSIS — R0789 Other chest pain: Secondary | ICD-10-CM | POA: Insufficient documentation

## 2024-08-12 DIAGNOSIS — R079 Chest pain, unspecified: Secondary | ICD-10-CM

## 2024-08-12 LAB — BASIC METABOLIC PANEL WITH GFR
Anion gap: 9 (ref 5–15)
BUN: 10 mg/dL (ref 6–20)
CO2: 27 mmol/L (ref 22–32)
Calcium: 9.4 mg/dL (ref 8.9–10.3)
Chloride: 104 mmol/L (ref 98–111)
Creatinine, Ser: 0.56 mg/dL (ref 0.44–1.00)
GFR, Estimated: >60 mL/min (ref >60)
Glucose, Bld: 88 mg/dL (ref 70–99)
Potassium: 4.1 mmol/L (ref 3.5–5.1)
Sodium: 139 mmol/L (ref 135–145)

## 2024-08-12 LAB — TROPONIN T, HIGH SENSITIVITY: Troponin T High Sensitivity: <15 ng/L (ref 0–19)

## 2024-08-12 LAB — CBC
HCT: 37.2 % (ref 36.0–46.0)
Hemoglobin: 12.1 g/dL (ref 12.0–15.0)
MCH: 28.7 pg (ref 26.0–34.0)
MCHC: 32.5 g/dL (ref 30.0–36.0)
MCV: 88.4 fL (ref 80.0–100.0)
Platelets: 278 K/uL (ref 150–400)
RBC: 4.21 MIL/uL (ref 3.87–5.11)
RDW: 12.9 % (ref 11.5–15.5)
WBC: 4.8 K/uL (ref 4.0–10.5)
nRBC: 0 % (ref 0.0–0.2)

## 2024-08-12 LAB — PREGNANCY, URINE: Preg Test, Ur: NEGATIVE

## 2024-08-12 MED ORDER — KETOROLAC TROMETHAMINE 15 MG/ML IJ SOLN
15.0000 mg | Freq: Once | INTRAMUSCULAR | Status: AC
  Administered 2024-08-12: 15 mg via INTRAMUSCULAR
  Filled 2024-08-12: qty 1

## 2024-08-12 MED ORDER — NAPROXEN 500 MG PO TABS
500.0000 mg | ORAL_TABLET | Freq: Two times a day (BID) | ORAL | 0 refills | Status: AC
  Filled 2024-08-12: qty 30, 15d supply, fill #0

## 2024-08-12 NOTE — ED Provider Notes (Signed)
 [facility] AT [facility] Provider Note   CSN: [id number] Arrival date & time: 08/12/24  1044     Patient presents with: Chest Pain   Robyn Cook is a 23 y.o. female with a past medical history significant for asthma who presents to the ED due to chest pain x 1 week.  Patient states chest pain starts in the center of her chest and radiates to left shoulder and down left upper extremity.  Pain worse with palpation of anterior chest wall.  Denies associated shortness of breath.  No history of blood clots, recent surgeries, recent long immobilizations, or hormonal treatments.  Denies lower extremity edema.  No cardiac history.  History obtained from patient and past medical records. No interpreter used during encounter.      Prior to Admission medications   Medication Sig Start Date End Date Taking? Authorizing Provider  naproxen  (NAPROSYN ) 500 MG tablet Take 1 tablet (500 mg total) by mouth 2 (two) times daily. 08/12/24  Yes Elysabeth Aust, Aleck BROCKS, PA-C  albuterol  (PROVENTIL ) (2.5 MG/3ML) 0.083% nebulizer solution Take 3 mLs (2.5 mg total) by nebulization every 6 (six) hours as needed for wheezing or shortness of breath. 08/02/22   Lynwood Lenis, PA-C  albuterol  (VENTOLIN  HFA) 108 (90 Base) MCG/ACT inhaler Inhale 2 puffs into the lungs every 6 (six) hours as needed for wheezing or shortness of breath. 10/10/21   Tysinger, Lenis RAMAN, PA-C  albuterol  (VENTOLIN  HFA) 108 (90 Base) MCG/ACT inhaler Inhale 2 puffs into the lungs every 6 (six) hours as needed for wheezing. 08/02/22   Lynwood Lenis, PA-C  budesonide -formoterol  (SYMBICORT ) 160-4.5 MCG/ACT inhaler Inhale 2 puffs into the lungs 2 (two) times daily. 08/02/22   Lynwood Lenis, PA-C  hydrOXYzine  (ATARAX ) 25 MG tablet Take 1 tablet (25 mg total) by mouth every 8 (eight) hours as needed for anxiety. 12/24/23   Long, Fonda MATSU, MD  montelukast  (SINGULAIR ) 10 MG tablet Take 1 tablet (10 mg total) by mouth at bedtime.  08/02/22   Lynwood Lenis, PA-C  Multiple Vitamins-Minerals (EMERGEN-C IMMUNE PLUS) PACK Take 1 tablet by mouth 2 (two) times daily. 10/10/21   Tysinger, Lenis RAMAN, PA-C  naproxen  (NAPROSYN ) 500 MG tablet Take 1 tablet (500 mg total) by mouth 2 (two) times daily as needed (pain). 03/20/23   Vonna Sharlet POUR, MD    Allergies: Peanut-containing drug products and Latex    Review of Systems  Constitutional:  Negative for chills and fever.  Respiratory:  Negative for shortness of breath.   Cardiovascular:  Positive for chest pain. Negative for leg swelling.    Updated Vital Signs BP 132/86 (BP Location: Right Arm)   Pulse 91   Temp 97.9 F (36.6 C)   Resp 18   Ht 4' 9 (1.448 m)   Wt 108.9 kg   LMP 07/09/2024   SpO2 100%   BMI 51.94 kg/m   Physical Exam Vitals and nursing note reviewed.  Constitutional:      General: She is not in acute distress.    Appearance: She is not ill-appearing.  HENT:     Head: Normocephalic.  Eyes:     Pupils: Pupils are equal, round, and reactive to light.  Cardiovascular:     Rate and Rhythm: Normal rate and regular rhythm.     Pulses: Normal pulses.     Heart sounds: Normal heart sounds. No murmur heard.    No friction rub. No gallop.  Pulmonary:     Effort: Pulmonary effort is  normal.     Breath sounds: Normal breath sounds.  Chest:     Comments: Tenderness throughout anterior chest wall without crepitus or deformity Abdominal:     General: Abdomen is flat. There is no distension.     Palpations: Abdomen is soft.     Tenderness: There is no abdominal tenderness. There is no guarding or rebound.  Musculoskeletal:        General: Normal range of motion.     Cervical back: Neck supple.  Skin:    General: Skin is warm and dry.  Neurological:     General: No focal deficit present.     Mental Status: She is alert.  Psychiatric:        Mood and Affect: Mood normal.        Behavior: Behavior normal.     (all labs ordered are listed, but only  abnormal results are displayed) Labs Reviewed  BASIC METABOLIC PANEL WITH GFR  CBC  PREGNANCY, URINE  TROPONIN T, HIGH SENSITIVITY    EKG: None  Radiology: DG Chest 2 View Result Date: 08/12/2024 CLINICAL DATA:  Chest pain. EXAM: CHEST - 2 VIEW COMPARISON:  10/24/2019. FINDINGS: The heart size and mediastinal contours are within normal limits. No focal consolidation, pleural effusion, or pneumothorax. No acute osseous abnormality. IMPRESSION: No acute cardiopulmonary findings. Electronically Signed   By: Harrietta Sherry M.D.   On: 08/12/2024 11:45     Procedures   Medications Ordered in the ED  ketorolac (TORADOL) 15 MG/ML injection 15 mg (15 mg Intramuscular Given 08/12/24 1253)                                    Medical Decision Making Amount and/or Complexity of Data Reviewed Labs: ordered. Decision-making details documented in ED Course. Radiology: ordered and independent interpretation performed. Decision-making details documented in ED Course. ECG/medicine tests: ordered and independent interpretation performed. Decision-making details documented in ED Course.  Risk Prescription drug management.   This patient presents to the ED for concern of chest pain, this involves an extensive number of treatment options, and is a complaint that carries with it a high risk of complications and morbidity.  The differential diagnosis includes ACS, PE, aortic dissection, MSK, PNA, etc  23 year old female presents to the ED due to chest pain that radiates down the left upper extremity x 1 week.  No cardiac history.  History of asthma.  Denies shortness of breath.  No lower extremity edema.  Upon arrival, vitals all within normal limits.  Patient well-appearing on exam. She does have reproducible tenderness to anterior chest wall without crepitus or deformity.  Lungs clear to auscultation bilaterally without stridor or wheeze.  No lower extremity edema.  No calf tenderness.  PERC  negative and low risk using Wells criteria.  Low suspicion for PE/DVT.  Cardiac labs ordered.  Possible MSK etiology given reproducible nature on exam.  Toradol given.  CBC unremarkable.  No leukocytosis.  Normal hemoglobin.  BMP unremarkable.  Normal renal function.  No major electrolyte derangements.  Troponin normal.  EKG normal sinus rhythm.  No signs of acute ischemia.  Low suspicion for ACS.  Chest x-ray personally reviewed and interpreted which is negative for pneumonia, pneumothorax, widened mediastinum.  Upon reassessment, patient notes some improvement after Toradol.  Possible MSK etiology given reproducible nature on exam.  Low suspicion for ACS.  PERC negative and low risk using Wells criteria.  Low suspicion for PE/DVT.  Presentation not concerning for aortic dissection.  Patient stable for discharge.  Patient discharged with naproxen . Strict ED precautions discussed with patient. Patient states understanding and agrees to plan. Patient discharged home in no acute distress and stable vitals  Co morbidities that complicate the patient evaluation  Asthma Cardiac Monitoring: / EKG:  The patient was maintained on a cardiac monitor.  I personally viewed and interpreted the cardiac monitored which showed an underlying rhythm of: NSR  Social Determinants of Health:  Lives independently  Test / Admission - Considered:  Considered admission however, workup reassuring.  Low suspicion for any emergent etiologies of chest pain.  Possible MSK etiology.  Pain well-controlled here in the ED.  Patient stable for discharge.     Final diagnoses:  Nonspecific chest pain    ED Discharge Orders          Ordered    naproxen  (NAPROSYN ) 500 MG tablet  2 times daily        08/12/24 1329               Lorelle Aleck BROCKS, PA-C 08/12/24 1338    Zackowski, Scott, MD 08/14/24 1026

## 2024-08-12 NOTE — Discharge Instructions (Addendum)
 It was a pleasure taking care of you today.  As discussed, your cardiac lab was normal.  EKG was reassuring.  I am sending you home with pain medication.  Take as needed for pain.  Please follow-up with PCP if symptoms do not improve over the next few days.  Return to the ER for any worsening symptoms.

## 2024-08-12 NOTE — ED Triage Notes (Addendum)
 Chest pain, center to L arm and back to shoulder blade x 1 week. Denies cardiac hx, drug use. Hx of asthma
